# Patient Record
Sex: Female | Born: 1975 | Race: White | Hispanic: No | Marital: Single | Smoking: Former smoker
Health system: Southern US, Community
[De-identification: ages and names within clinical notes are randomized; demographics above are authoritative.]

## PROBLEM LIST (undated history)

## (undated) DIAGNOSIS — K219 Gastro-esophageal reflux disease without esophagitis: Secondary | ICD-10-CM

## (undated) DIAGNOSIS — R51 Headache: Secondary | ICD-10-CM

## (undated) DIAGNOSIS — B009 Herpesviral infection, unspecified: Secondary | ICD-10-CM

## (undated) DIAGNOSIS — N879 Dysplasia of cervix uteri, unspecified: Secondary | ICD-10-CM

## (undated) HISTORY — PX: OTHER SURGICAL HISTORY: SHX169

## (undated) HISTORY — PX: CRYOABLATION: SHX1415

## (undated) HISTORY — PX: WISDOM TOOTH EXTRACTION: SHX21

## (undated) HISTORY — PX: SEPTOPLASTY: SUR1290

---

## 2011-07-18 ENCOUNTER — Other Ambulatory Visit (HOSPITAL_COMMUNITY): Payer: Self-pay | Admitting: Gynecology

## 2011-07-18 DIAGNOSIS — N979 Female infertility, unspecified: Secondary | ICD-10-CM

## 2011-07-25 ENCOUNTER — Ambulatory Visit (HOSPITAL_COMMUNITY)
Admission: RE | Admit: 2011-07-25 | Discharge: 2011-07-25 | Disposition: A | Discharge status: Home / Self Care | Payer: BC Managed Care – PPO | Source: Ambulatory Visit | Attending: Gynecology | Admitting: Gynecology

## 2011-07-25 DIAGNOSIS — N979 Female infertility, unspecified: Secondary | ICD-10-CM | POA: Insufficient documentation

## 2011-07-25 MED ORDER — IOHEXOL 300 MG/ML  SOLN
6.0000 mL | Freq: Once | INTRAMUSCULAR | Status: AC | PRN
  Administered 2011-07-25: 6 mL

## 2011-08-11 ENCOUNTER — Ambulatory Visit (INDEPENDENT_AMBULATORY_CARE_PROVIDER_SITE_OTHER): Payer: BC Managed Care – PPO | Admitting: Psychology

## 2011-08-11 DIAGNOSIS — F4322 Adjustment disorder with anxiety: Secondary | ICD-10-CM

## 2013-02-18 LAB — OB RESULTS CONSOLE ABO/RH: RH Type: POSITIVE

## 2013-02-18 LAB — OB RESULTS CONSOLE GC/CHLAMYDIA
Chlamydia: NEGATIVE
GC PROBE AMP, GENITAL: NEGATIVE

## 2013-02-18 LAB — OB RESULTS CONSOLE RPR: RPR: NONREACTIVE

## 2013-02-18 LAB — OB RESULTS CONSOLE HEPATITIS B SURFACE ANTIGEN: Hepatitis B Surface Ag: NEGATIVE

## 2013-02-18 LAB — OB RESULTS CONSOLE RUBELLA ANTIBODY, IGM: Rubella: IMMUNE

## 2013-02-18 LAB — OB RESULTS CONSOLE HIV ANTIBODY (ROUTINE TESTING): HIV: NONREACTIVE

## 2013-02-18 LAB — OB RESULTS CONSOLE ANTIBODY SCREEN: ANTIBODY SCREEN: NEGATIVE

## 2013-07-04 ENCOUNTER — Other Ambulatory Visit (HOSPITAL_COMMUNITY): Payer: Self-pay | Admitting: Obstetrics and Gynecology

## 2013-07-04 DIAGNOSIS — R1011 Right upper quadrant pain: Secondary | ICD-10-CM

## 2013-07-07 ENCOUNTER — Ambulatory Visit (HOSPITAL_COMMUNITY)
Admission: RE | Admit: 2013-07-07 | Discharge: 2013-07-07 | Disposition: A | Discharge status: Home / Self Care | Payer: BC Managed Care – PPO | Source: Ambulatory Visit | Attending: Obstetrics and Gynecology | Admitting: Obstetrics and Gynecology

## 2013-07-07 DIAGNOSIS — O99891 Other specified diseases and conditions complicating pregnancy: Secondary | ICD-10-CM | POA: Insufficient documentation

## 2013-07-07 DIAGNOSIS — R1011 Right upper quadrant pain: Secondary | ICD-10-CM

## 2013-09-11 NOTE — H&P (Addendum)
38 yo presents for primary c-section.  [place] in office shows EFW of 4144gm, pt elects for primary c-section over trial of labor  PMHx: PSHx: All:   Meds:  PNV SHx:  Negative tobacco, etoh and ivdu  AF, VSS Gen - NAD Abd - gravid, NT CV - RRR Lungs - clear PV - cvx closed  [place]:  Vtx, EFW 4144gm, AFI 97%  A/P:  IUP at 39+ wks, suspect LGA.  Declines TOL Primary c-section R/b/a discussed, questions answered, informed consent

## 2013-09-12 ENCOUNTER — Encounter (HOSPITAL_COMMUNITY)
Admission: RE | Admit: 2013-09-12 | Discharge: 2013-09-12 | Disposition: A | Discharge status: Home / Self Care | Payer: BC Managed Care – PPO | Source: Ambulatory Visit | Attending: Obstetrics and Gynecology | Admitting: Obstetrics and Gynecology

## 2013-09-12 ENCOUNTER — Encounter (HOSPITAL_COMMUNITY): Payer: Self-pay

## 2013-09-12 DIAGNOSIS — Z01812 Encounter for preprocedural laboratory examination: Secondary | ICD-10-CM

## 2013-09-12 HISTORY — DX: Herpesviral infection, unspecified: B00.9

## 2013-09-12 HISTORY — DX: Dysplasia of cervix uteri, unspecified: N87.9

## 2013-09-12 HISTORY — DX: Headache: R51

## 2013-09-12 HISTORY — DX: Gastro-esophageal reflux disease without esophagitis: K21.9

## 2013-09-12 LAB — CBC
HCT: 40.6 % (ref 36.0–46.0)
Hemoglobin: 14 g/dL (ref 12.0–15.0)
MCH: 32.6 pg (ref 26.0–34.0)
MCHC: 34.5 g/dL (ref 30.0–36.0)
MCV: 94.4 fL (ref 78.0–100.0)
PLATELETS: 204 10*3/uL (ref 150–400)
RBC: 4.3 MIL/uL (ref 3.87–5.11)
RDW: 14.7 % (ref 11.5–15.5)
WBC: 7.6 10*3/uL (ref 4.0–10.5)

## 2013-09-12 LAB — TYPE AND SCREEN
ABO/RH(D): A POS
Antibody Screen: NEGATIVE

## 2013-09-12 LAB — ABO/RH: ABO/RH(D): A POS

## 2013-09-12 LAB — RPR: RPR Ser Ql: NONREACTIVE

## 2013-09-12 NOTE — Patient Instructions (Signed)
Your procedure is scheduled on:  Monday, September 15, 2013  Enter through the [employer] [facility] at: 6:00am  Pick up the phone at the desk and dial [phone].  Call this number if you have problems the morning of surgery: [phone].  Remember: Do NOT eat food: After midnight Sunday Do NOT drink clear liquids after: After midnight Sunday Take these medicines the morning of surgery with a SIP OF WATER: Zantac  Do NOT wear jewelry (body piercing), make-up, or nail polish. Do NOT wear lotions, powders, or perfumes.  You may wear deoderant. Do NOT shave for 48 hours prior to surgery. Do NOT bring valuables to the hospital. Contacts, dentures, or bridgework may not be worn into surgery. Leave suitcase in car.  After surgery it may be brought to your room.  For patients admitted to the hospital, checkout time is 11:00 AM the day of discharge.

## 2013-09-14 MED ORDER — DEXTROSE 5 % IV SOLN
2.0000 g | INTRAVENOUS | Status: AC
  Administered 2013-09-15: 2 g via INTRAVENOUS
  Filled 2013-09-14: qty 2

## 2013-09-15 ENCOUNTER — Inpatient Hospital Stay (HOSPITAL_COMMUNITY): Payer: BC Managed Care – PPO | Admitting: Anesthesiology

## 2013-09-15 ENCOUNTER — Encounter (HOSPITAL_COMMUNITY)
Admission: AD | Disposition: A | Discharge status: Home / Self Care | Payer: Self-pay | Source: Ambulatory Visit | Attending: Obstetrics and Gynecology

## 2013-09-15 ENCOUNTER — Inpatient Hospital Stay (HOSPITAL_COMMUNITY)
Admission: AD | Admit: 2013-09-15 | Discharge: 2013-09-18 | DRG: 766 | Disposition: A | Discharge status: Home / Self Care | Payer: BC Managed Care – PPO | Source: Ambulatory Visit | Attending: Obstetrics and Gynecology | Admitting: Obstetrics and Gynecology

## 2013-09-15 ENCOUNTER — Encounter (HOSPITAL_COMMUNITY): Payer: Self-pay | Admitting: Anesthesiology

## 2013-09-15 ENCOUNTER — Encounter (HOSPITAL_COMMUNITY): Payer: BC Managed Care – PPO | Admitting: Anesthesiology

## 2013-09-15 DIAGNOSIS — Z98891 History of uterine scar from previous surgery: Secondary | ICD-10-CM

## 2013-09-15 DIAGNOSIS — O3660X0 Maternal care for excessive fetal growth, unspecified trimester, not applicable or unspecified: Principal | ICD-10-CM | POA: Diagnosis present

## 2013-09-15 HISTORY — DX: History of uterine scar from previous surgery: Z98.891

## 2013-09-15 LAB — CBC
HCT: 40.3 % (ref 36.0–46.0)
HEMOGLOBIN: 14 g/dL (ref 12.0–15.0)
MCH: 32.7 pg (ref 26.0–34.0)
MCHC: 34.7 g/dL (ref 30.0–36.0)
MCV: 94.2 fL (ref 78.0–100.0)
PLATELETS: 202 10*3/uL (ref 150–400)
RBC: 4.28 MIL/uL (ref 3.87–5.11)
RDW: 14.9 % (ref 11.5–15.5)
WBC: 9.9 10*3/uL (ref 4.0–10.5)

## 2013-09-15 SURGERY — Surgical Case
Anesthesia: Spinal | Site: Abdomen

## 2013-09-15 MED ORDER — PHENYLEPHRINE 40 MCG/ML (10ML) SYRINGE FOR IV PUSH (FOR BLOOD PRESSURE SUPPORT)
PREFILLED_SYRINGE | INTRAVENOUS | Status: AC
  Filled 2013-09-15: qty 5

## 2013-09-15 MED ORDER — SCOPOLAMINE 1 MG/3DAYS TD PT72
1.0000 | MEDICATED_PATCH | Freq: Once | TRANSDERMAL | Status: DC
  Administered 2013-09-15: 1.5 mg via TRANSDERMAL

## 2013-09-15 MED ORDER — ONDANSETRON HCL 4 MG PO TABS: 4.0000 mg | ORAL_TABLET | ORAL | Status: DC | PRN

## 2013-09-15 MED ORDER — ONDANSETRON HCL 4 MG/2ML IJ SOLN
INTRAMUSCULAR | Status: AC
  Filled 2013-09-15: qty 2

## 2013-09-15 MED ORDER — BUPIVACAINE IN DEXTROSE 0.75-8.25 % IT SOLN
INTRATHECAL | Status: DC | PRN
  Administered 2013-09-15: 1.8 mL via INTRATHECAL

## 2013-09-15 MED ORDER — OXYTOCIN 40 UNITS IN LACTATED RINGERS INFUSION - SIMPLE MED: 62.5000 mL/h | INTRAVENOUS | Status: AC

## 2013-09-15 MED ORDER — ONDANSETRON HCL 4 MG/2ML IJ SOLN: 4.0000 mg | Freq: Three times a day (TID) | INTRAMUSCULAR | Status: DC | PRN

## 2013-09-15 MED ORDER — CITRIC ACID-SODIUM CITRATE 334-500 MG/5ML PO SOLN
ORAL | Status: AC
  Administered 2013-09-15: 30 mL via ORAL
  Filled 2013-09-15: qty 15

## 2013-09-15 MED ORDER — FENTANYL CITRATE 0.05 MG/ML IJ SOLN
INTRAMUSCULAR | Status: AC
  Filled 2013-09-15: qty 2

## 2013-09-15 MED ORDER — LANOLIN HYDROUS EX OINT
1.0000 | TOPICAL_OINTMENT | CUTANEOUS | Status: DC | PRN
Start: 2013-09-15 — End: 2013-09-18

## 2013-09-15 MED ORDER — MEPERIDINE HCL 25 MG/ML IJ SOLN: 6.2500 mg | INTRAMUSCULAR | Status: DC | PRN

## 2013-09-15 MED ORDER — DIPHENHYDRAMINE HCL 25 MG PO CAPS: 25.0000 mg | ORAL_CAPSULE | Freq: Four times a day (QID) | ORAL | Status: DC | PRN

## 2013-09-15 MED ORDER — PHENYLEPHRINE 8 MG IN D5W 100 ML (0.08MG/ML) PREMIX OPTIME
INJECTION | INTRAVENOUS | Status: DC | PRN
  Administered 2013-09-15: 40 ug/min via INTRAVENOUS
  Administered 2013-09-15: 60 ug/min via INTRAVENOUS

## 2013-09-15 MED ORDER — NALOXONE HCL 1 MG/ML IJ SOLN
1.0000 ug/kg/h | INTRAMUSCULAR | Status: DC | PRN
  Filled 2013-09-15: qty 2

## 2013-09-15 MED ORDER — SODIUM CHLORIDE 0.9 % IJ SOLN: 3.0000 mL | INTRAMUSCULAR | Status: DC | PRN

## 2013-09-15 MED ORDER — LACTATED RINGERS IV SOLN
INTRAVENOUS | Status: DC
  Administered 2013-09-15 (×3): via INTRAVENOUS

## 2013-09-15 MED ORDER — NALOXONE HCL 0.4 MG/ML IJ SOLN: 0.4000 mg | INTRAMUSCULAR | Status: DC | PRN

## 2013-09-15 MED ORDER — CITRIC ACID-SODIUM CITRATE 334-500 MG/5ML PO SOLN
30.0000 mL | Freq: Once | ORAL | Status: AC
  Administered 2013-09-15: 30 mL via ORAL

## 2013-09-15 MED ORDER — OXYCODONE-ACETAMINOPHEN 5-325 MG PO TABS: 1.0000 | ORAL_TABLET | ORAL | Status: DC | PRN

## 2013-09-15 MED ORDER — MIDAZOLAM HCL 2 MG/2ML IJ SOLN: 0.5000 mg | Freq: Once | INTRAMUSCULAR | Status: DC | PRN

## 2013-09-15 MED ORDER — DIPHENHYDRAMINE HCL 50 MG/ML IJ SOLN
25.0000 mg | INTRAMUSCULAR | Status: DC | PRN
Start: 2013-09-15 — End: 2013-09-18

## 2013-09-15 MED ORDER — OXYTOCIN 40 UNITS IN LACTATED RINGERS INFUSION - SIMPLE MED
INTRAVENOUS | Status: DC | PRN
  Administered 2013-09-15: 40 [IU] via INTRAVENOUS

## 2013-09-15 MED ORDER — TETANUS-DIPHTH-ACELL PERTUSSIS 5-2.5-18.5 LF-MCG/0.5 IM SUSP: 0.5000 mL | Freq: Once | INTRAMUSCULAR | Status: DC

## 2013-09-15 MED ORDER — PROMETHAZINE HCL 25 MG/ML IJ SOLN: 6.2500 mg | INTRAMUSCULAR | Status: DC | PRN

## 2013-09-15 MED ORDER — DEXTROSE IN LACTATED RINGERS 5 % IV SOLN
INTRAVENOUS | Status: DC
  Administered 2013-09-15: 125 mL/h via INTRAVENOUS
  Administered 2013-09-16: 03:00:00 via INTRAVENOUS

## 2013-09-15 MED ORDER — NALBUPHINE HCL 10 MG/ML IJ SOLN: 5.0000 mg | INTRAMUSCULAR | Status: DC | PRN

## 2013-09-15 MED ORDER — PHENYLEPHRINE HCL 10 MG/ML IJ SOLN: INTRAMUSCULAR | Status: DC | PRN

## 2013-09-15 MED ORDER — SIMETHICONE 80 MG PO CHEW
80.0000 mg | CHEWABLE_TABLET | Freq: Three times a day (TID) | ORAL | Status: DC
  Administered 2013-09-15 – 2013-09-17 (×7): 80 mg via ORAL
  Filled 2013-09-15 (×8): qty 1

## 2013-09-15 MED ORDER — FENTANYL CITRATE 0.05 MG/ML IJ SOLN: 25.0000 ug | INTRAMUSCULAR | Status: DC | PRN

## 2013-09-15 MED ORDER — PRENATAL MULTIVITAMIN CH
1.0000 | ORAL_TABLET | Freq: Every day | ORAL | Status: DC
  Administered 2013-09-16 – 2013-09-17 (×2): 1 via ORAL
  Filled 2013-09-15 (×2): qty 1

## 2013-09-15 MED ORDER — MEASLES, MUMPS & RUBELLA VAC ~~LOC~~ INJ
0.5000 mL | INJECTION | Freq: Once | SUBCUTANEOUS | Status: DC
  Filled 2013-09-15: qty 0.5

## 2013-09-15 MED ORDER — KETOROLAC TROMETHAMINE 30 MG/ML IJ SOLN
30.0000 mg | Freq: Four times a day (QID) | INTRAMUSCULAR | Status: AC | PRN
  Administered 2013-09-15: 30 mg via INTRAMUSCULAR

## 2013-09-15 MED ORDER — SCOPOLAMINE 1 MG/3DAYS TD PT72
MEDICATED_PATCH | TRANSDERMAL | Status: AC
Start: 2013-09-15 — End: 2013-09-18
  Administered 2013-09-15: 1.5 mg via TRANSDERMAL
  Filled 2013-09-15: qty 1

## 2013-09-15 MED ORDER — MORPHINE SULFATE 0.5 MG/ML IJ SOLN
INTRAMUSCULAR | Status: AC
  Filled 2013-09-15: qty 10

## 2013-09-15 MED ORDER — DIPHENHYDRAMINE HCL 50 MG/ML IJ SOLN: 12.5000 mg | INTRAMUSCULAR | Status: DC | PRN

## 2013-09-15 MED ORDER — HYDROCORTISONE ACE-PRAMOXINE 1-1 % RE FOAM
1.0000 | Freq: Three times a day (TID) | RECTAL | Status: DC
  Administered 2013-09-15 – 2013-09-17 (×4): 1 via RECTAL
  Filled 2013-09-15: qty 10

## 2013-09-15 MED ORDER — LACTATED RINGERS IV SOLN
INTRAVENOUS | Status: DC | PRN
  Administered 2013-09-15: 08:00:00 via INTRAVENOUS

## 2013-09-15 MED ORDER — KETOROLAC TROMETHAMINE 30 MG/ML IJ SOLN: 30.0000 mg | Freq: Four times a day (QID) | INTRAMUSCULAR | Status: AC | PRN

## 2013-09-15 MED ORDER — SIMETHICONE 80 MG PO CHEW: 80.0000 mg | CHEWABLE_TABLET | ORAL | Status: DC | PRN

## 2013-09-15 MED ORDER — KETOROLAC TROMETHAMINE 30 MG/ML IJ SOLN
INTRAMUSCULAR | Status: AC
  Administered 2013-09-15: 30 mg via INTRAMUSCULAR
  Filled 2013-09-15: qty 1

## 2013-09-15 MED ORDER — MORPHINE SULFATE (PF) 0.5 MG/ML IJ SOLN
INTRAMUSCULAR | Status: DC | PRN
  Administered 2013-09-15: .15 mg via INTRATHECAL

## 2013-09-15 MED ORDER — MEDROXYPROGESTERONE ACETATE 150 MG/ML IM SUSP: 150.0000 mg | INTRAMUSCULAR | Status: DC | PRN

## 2013-09-15 MED ORDER — PHENYLEPHRINE 8 MG IN D5W 100 ML (0.08MG/ML) PREMIX OPTIME
INJECTION | INTRAVENOUS | Status: AC
  Filled 2013-09-15: qty 100

## 2013-09-15 MED ORDER — 0.9 % SODIUM CHLORIDE (POUR BTL) OPTIME
TOPICAL | Status: DC | PRN
  Administered 2013-09-15: 1000 mL

## 2013-09-15 MED ORDER — FENTANYL CITRATE 0.05 MG/ML IJ SOLN
INTRAMUSCULAR | Status: DC | PRN
  Administered 2013-09-15: 25 ug via INTRATHECAL

## 2013-09-15 MED ORDER — SENNOSIDES-DOCUSATE SODIUM 8.6-50 MG PO TABS
2.0000 | ORAL_TABLET | ORAL | Status: DC
  Administered 2013-09-16 – 2013-09-18 (×3): 2 via ORAL
  Filled 2013-09-15 (×3): qty 2

## 2013-09-15 MED ORDER — METOCLOPRAMIDE HCL 5 MG/ML IJ SOLN: 10.0000 mg | Freq: Three times a day (TID) | INTRAMUSCULAR | Status: DC | PRN

## 2013-09-15 MED ORDER — IBUPROFEN 600 MG PO TABS
600.0000 mg | ORAL_TABLET | Freq: Four times a day (QID) | ORAL | Status: DC
  Administered 2013-09-15 – 2013-09-18 (×11): 600 mg via ORAL
  Filled 2013-09-15 (×11): qty 1

## 2013-09-15 MED ORDER — OXYTOCIN 10 UNIT/ML IJ SOLN
INTRAMUSCULAR | Status: AC
  Filled 2013-09-15: qty 4

## 2013-09-15 MED ORDER — DIBUCAINE 1 % RE OINT
1.0000 | TOPICAL_OINTMENT | RECTAL | Status: DC | PRN
Start: 2013-09-15 — End: 2013-09-18

## 2013-09-15 MED ORDER — MENTHOL 3 MG MT LOZG: 1.0000 | LOZENGE | OROMUCOSAL | Status: DC | PRN

## 2013-09-15 MED ORDER — HYDROCORTISONE ACE-PRAMOXINE 2.5-1 % RE CREA: TOPICAL_CREAM | Freq: Three times a day (TID) | RECTAL | Status: DC

## 2013-09-15 MED ORDER — WITCH HAZEL-GLYCERIN EX PADS
1.0000 "application " | MEDICATED_PAD | CUTANEOUS | Status: DC | PRN
  Administered 2013-09-15: 1 via TOPICAL

## 2013-09-15 MED ORDER — ACETAMINOPHEN 500 MG PO TABS
1000.0000 mg | ORAL_TABLET | Freq: Four times a day (QID) | ORAL | Status: AC
  Administered 2013-09-15: 1000 mg via ORAL
  Filled 2013-09-15: qty 2

## 2013-09-15 MED ORDER — SIMETHICONE 80 MG PO CHEW
80.0000 mg | CHEWABLE_TABLET | ORAL | Status: DC
  Administered 2013-09-16 – 2013-09-18 (×3): 80 mg via ORAL
  Filled 2013-09-15 (×3): qty 1

## 2013-09-15 MED ORDER — IBUPROFEN 600 MG PO TABS: 600.0000 mg | ORAL_TABLET | Freq: Four times a day (QID) | ORAL | Status: DC | PRN

## 2013-09-15 MED ORDER — SCOPOLAMINE 1 MG/3DAYS TD PT72
1.0000 | MEDICATED_PATCH | Freq: Once | TRANSDERMAL | Status: DC
  Filled 2013-09-15: qty 1

## 2013-09-15 MED ORDER — DIPHENHYDRAMINE HCL 25 MG PO CAPS: 25.0000 mg | ORAL_CAPSULE | ORAL | Status: DC | PRN

## 2013-09-15 MED ORDER — ONDANSETRON HCL 4 MG/2ML IJ SOLN: 4.0000 mg | INTRAMUSCULAR | Status: DC | PRN

## 2013-09-15 MED ORDER — ONDANSETRON HCL 4 MG/2ML IJ SOLN
INTRAMUSCULAR | Status: DC | PRN
  Administered 2013-09-15: 4 mg via INTRAVENOUS

## 2013-09-15 SURGICAL SUPPLY — 31 items
CLAMP CORD UMBIL (MISCELLANEOUS) IMPLANT
CLOTH BEACON ORANGE TIMEOUT ST (SAFETY) ×2 IMPLANT
DERMABOND ADVANCED (GAUZE/BANDAGES/DRESSINGS) ×1
DERMABOND ADVANCED .7 DNX12 (GAUZE/BANDAGES/DRESSINGS) ×1 IMPLANT
DRAPE LG THREE QUARTER DISP (DRAPES) ×2 IMPLANT
DRSG OPSITE POSTOP 4X10 (GAUZE/BANDAGES/DRESSINGS) ×2 IMPLANT
DURAPREP 26ML APPLICATOR (WOUND CARE) ×2 IMPLANT
ELECT REM PT RETURN 9FT ADLT (ELECTROSURGICAL) ×2
ELECTRODE REM PT RTRN 9FT ADLT (ELECTROSURGICAL) ×1 IMPLANT
EXTRACTOR VACUUM M CUP 4 TUBE (SUCTIONS) ×2 IMPLANT
GLOVE BIO SURGEON STRL SZ 6.5 (GLOVE) ×2 IMPLANT
GLOVE BIOGEL PI IND STRL 7.0 (GLOVE) ×3 IMPLANT
GLOVE BIOGEL PI INDICATOR 7.0 (GLOVE) ×3
GOWN PREVENTION PLUS XLARGE (GOWN DISPOSABLE) ×4 IMPLANT
GOWN STRL REIN XL XLG (GOWN DISPOSABLE) ×4 IMPLANT
KIT ABG SYR 3ML LUER SLIP (SYRINGE) IMPLANT
NEEDLE HYPO 25X5/8 SAFETYGLIDE (NEEDLE) IMPLANT
NS IRRIG 1000ML POUR BTL (IV SOLUTION) ×2 IMPLANT
PACK C SECTION WH (CUSTOM PROCEDURE TRAY) ×2 IMPLANT
PAD OB MATERNITY 4.3X12.25 (PERSONAL CARE ITEMS) ×2 IMPLANT
PENCIL BUTTON HOLSTER BLD 10FT (ELECTRODE) ×2 IMPLANT
STAPLER VISISTAT 35W (STAPLE) IMPLANT
SUT CHROMIC 0 CT 802H (SUTURE) IMPLANT
SUT CHROMIC 0 CTX 36 (SUTURE) ×6 IMPLANT
SUT MON AB-0 CT1 36 (SUTURE) ×2 IMPLANT
SUT PDS AB 0 CTX 60 (SUTURE) ×2 IMPLANT
SUT PLAIN 0 NONE (SUTURE) IMPLANT
SUT VIC AB 4-0 KS 27 (SUTURE) ×2 IMPLANT
TOWEL OR 17X24 6PK STRL BLUE (TOWEL DISPOSABLE) ×2 IMPLANT
TRAY FOLEY CATH 14FR (SET/KITS/TRAYS/PACK) ×2 IMPLANT
WATER STERILE IRR 1000ML POUR (IV SOLUTION) IMPLANT

## 2013-09-15 NOTE — Anesthesia Procedure Notes (Signed)
Spinal  Patient location during procedure: OR Start time: 09/15/2013 7:28 AM Staffing Anesthesiologist: Etoile Looman A. Preanesthetic Checklist Completed: patient identified, site marked, surgical consent, pre-op evaluation, timeout performed, IV checked, risks and benefits discussed and monitors and equipment checked Spinal Block Patient position: sitting Prep: site prepped and draped and DuraPrep Patient monitoring: heart rate, cardiac monitor, continuous pulse ox and blood pressure Approach: midline Location: L3-4 Injection technique: single-shot Needle Needle type: Sprotte  Needle gauge: 24 G Needle length: 9 cm Needle insertion depth: 5 cm Assessment Sensory level: T4 Events: paresthesia Additional Notes Patient tolerated procedure well. Transient paresthesia right foot. Adequate sensory level.

## 2013-09-15 NOTE — Anesthesia Preprocedure Evaluation (Signed)
Anesthesia Evaluation  Patient identified by MRN, date of birth, ID band Patient awake    Reviewed: Allergy & Precautions, H&P , NPO status , Patient's Chart, lab work & pertinent test results  Airway Mallampati: II      Dental   Pulmonary former smoker,  breath sounds clear to auscultation        Cardiovascular Exercise Tolerance: Good Rhythm:regular Rate:Normal     Neuro/Psych  Headaches,    GI/Hepatic GERD-  ,  Endo/Other    Renal/GU      Musculoskeletal   Abdominal   Peds  Hematology   Anesthesia Other Findings HSV  Reproductive/Obstetrics (+) Pregnancy                           Anesthesia Physical Anesthesia Plan  ASA: II  Anesthesia Plan: Spinal   Post-op Pain Management:    Induction:   Airway Management Planned:   Additional Equipment:   Intra-op Plan:   Post-operative Plan:   Informed Consent: I have reviewed the patients History and Physical, chart, labs and discussed the procedure including the risks, benefits and alternatives for the proposed anesthesia with the patient or authorized representative who has indicated his/her understanding and acceptance.     Plan Discussed with: Anesthesiologist, CRNA and Surgeon  Anesthesia Plan Comments:         Anesthesia Quick Evaluation

## 2013-09-15 NOTE — Op Note (Signed)
Cesarean Section Procedure Note   Ann Gonzalez  09/15/2013  Indications: Scheduled Proceedure/Maternal Request   Pre-operative Diagnosis: Suspect Large for gestational age.   Post-operative Diagnosis: Same   Surgeon: Surgeon(s) and Role:    * Marylynn Pearson, MD - Primary   Assistants: none  Anesthesia: spinal   Procedure Details:  The patient was seen in the Holding Room. The risks, benefits, complications, treatment options, and expected outcomes were discussed with the patient. The patient concurred with the proposed plan, giving informed consent. identified as Ann Gonzalez and the procedure verified as C-Section Delivery. A Time Out was held and the above information confirmed.  After induction of anesthesia, the patient was draped and prepped in the usual sterile manner. A transverse was made and carried down through the subcutaneous tissue to the fascia. Fascial incision was made and extended transversely. The fascia was separated from the underlying rectus tissue superiorly and inferiorly. The peritoneum was identified and entered. Peritoneal incision was extended longitudinally. The utero-vesical peritoneal reflection was incised transversely and the bladder flap was bluntly freed from the lower uterine segment. A low transverse uterine incision was made. Delivered from cephalic presentation was a vigerous female with Apgar scores of 8 at one minute and 8 at five minutes. Cord ph was not sent the umbilical cord was clamped and cut cord blood was obtained for evaluation. The placenta was removed manually and appeared normal. The uterine outline, tubes and ovaries appeared normal}. The uterine incision was closed with running locked sutures of 0chromic gut.   Hemostasis was observed. Lavage was carried out until clear. Peritoneum was closed with monocryl.   The fascia was then reapproximated with running sutures of 0PDS. The skin was closed with 4-0Vicryl.   Instrument, sponge,  and needle counts were correct prior the abdominal closure and were correct at the conclusion of the case.     Estimated Blood Loss:800cc   Urine Output: clear  Specimens: placenta   Complications: no complications  Disposition: PACU - hemodynamically stable.   Maternal Condition: stable   Baby condition / location:  Couplet care / Skin to Skin  Attending Attestation: I was present and scrubbed for the entire procedure.   Signed: Surgeon(s): Marylynn Pearson, MD

## 2013-09-15 NOTE — Lactation Note (Signed)
This note was copied from the chart of Ann Gonzalez. Lactation Consultation Note  Patient Name: Ann Cande Mastropietro Today's Date: 09/15/2013 Reason for consult: Initial assessment This LC called to see Dyad in room #123 due to low blood sugar of 37 .  Baby had latched well in PACU per mom , and since 3 mins and released. MBU RN Candyce Churn at bedside working with mom with latch . Baby latched with depth  Multiply swallows noted , increased with breast compressions. Baby fed for 14 mins and was  Consistent . Baby released , switched to 2nd breast ( left ) and fed for 5 mins with multiply swallows ,baby released and fell asleep. Admission RN - Lovett Sox aware of good latch and consistent pattern.  Also MBU RN Candyce Churn aware of feeding results. Plan is for a F/U serum blood sugar.    Maternal Data Formula Feeding for Exclusion: No Infant to breast within first hour of birth: Yes Has patient been taught Hand Expression?: Yes Does the patient have breastfeeding experience prior to this delivery?: No  Feeding Feeding Type: Breast Fed (multiply swallows noted ) Length of feed: 5 min  LATCH Score/Interventions Latch: Grasps breast easily, tongue down, lips flanged, rhythmical sucking. Intervention(s): Adjust position;Assist with latch;Breast massage;Breast compression  Audible Swallowing: Spontaneous and intermittent Intervention(s): Skin to skin Intervention(s): Skin to skin  Type of Nipple: Everted at rest and after stimulation  Comfort (Breast/Nipple): Soft / non-tender     Hold (Positioning): Assistance needed to correctly position infant at breast and maintain latch. Intervention(s): Breastfeeding basics reviewed;Support Pillows;Position options;Skin to skin  LATCH Score: 9  Lactation Tools Discussed/Used     Consult Status Consult Status: Follow-up Date: 09/16/13 Follow-up type: In-patient    Myer Haff 09/15/2013, 4:40 PM

## 2013-09-15 NOTE — Lactation Note (Signed)
This note was copied from the chart of Ann Gonzalez. Lactation Consultation Note  Patient Name: Ann Gonzalez [id number] Date: 09/15/2013 Reason for consult: Follow-up assessment;Difficult latch Asked by RN to assist Mom with breastfeeding. Baby blood sugar has dropped again into the 30's. Baby sleepy upon arrival but LC was able to wake baby with diaper change. Attempted to latch baby to left breast in football hold, baby latched off and on for approx 10 minutes. Changed to right breast laid back position, Baby latched after few attempts and sustained a good suckling pattern at the breast for another 20 minutes. Full assist with LC at this visit to keep baby latched and positioned. Demonstrated to [place] partner how to assist with latching baby and how to perform breast compression if needed.   Maternal Data    Feeding Feeding Type: Formula Length of feed: 30 min  LATCH Score/Interventions Latch: Repeated attempts needed to sustain latch, nipple held in mouth throughout feeding, stimulation needed to elicit sucking reflex. Intervention(s): Adjust position;Assist with latch;Breast massage;Breast compression  Audible Swallowing: A few with stimulation Intervention(s): Skin to skin  Type of Nipple: Flat  Comfort (Breast/Nipple): Soft / non-tender     Hold (Positioning): Full assist, staff holds infant at breast Intervention(s): Breastfeeding basics reviewed;Support Pillows;Position options;Skin to skin  LATCH Score: 5  Lactation Tools Discussed/Used     Consult Status Consult Status: Follow-up Date: 09/15/13 Follow-up type: In-patient    [name] 09/15/2013, 11:10 PM

## 2013-09-15 NOTE — Transfer of Care (Signed)
Immediate Anesthesia Transfer of Care Note  Patient: Ann Gonzalez  Procedure(s) Performed: Procedure(s) with comments: CESAREAN SECTION (N/A) - PRIMARY EDC 1/20  Patient Location: PACU  Anesthesia Type:Spinal  Level of Consciousness: awake, alert  and oriented  Airway & Oxygen Therapy: Patient Spontanous Breathing  Post-op Assessment: Report given to PACU RN and Post -op Vital signs reviewed and stable  Post vital signs: Reviewed and stable  Complications: No apparent anesthesia complications

## 2013-09-15 NOTE — Anesthesia Postprocedure Evaluation (Signed)
Anesthesia Post Note  Patient: Ann Gonzalez  Procedure(s) Performed: Procedure(s) (LRB): CESAREAN SECTION (N/A)  Anesthesia type: Epidural  Patient location: Mother/Baby  Post pain: Pain level controlled  Post assessment: Post-op Vital signs reviewed  Last Vitals:  Filed Vitals:   09/15/13 1343  BP: 105/67  Pulse: 78  Temp:   Resp: 18    Post vital signs: Reviewed  Level of consciousness:alert  Complications: No apparent anesthesia complications

## 2013-09-15 NOTE — Anesthesia Postprocedure Evaluation (Signed)
  Anesthesia Post-op Note  Patient: Ann Gonzalez  Procedure(s) Performed: Procedure(s) with comments: CESAREAN SECTION (N/A) - PRIMARY EDC 1/20  Patient Location: PACU  Anesthesia Type:Spinal  Level of Consciousness: awake, alert  and oriented  Airway and Oxygen Therapy: Patient Spontanous Breathing  Post-op Pain: none  Post-op Assessment: Post-op Vital signs reviewed, Patient's Cardiovascular Status Stable, Respiratory Function Stable, Patent Airway, No signs of Nausea or vomiting, Pain level controlled, No headache, No backache, No residual numbness and No residual motor weakness  Post-op Vital Signs: Reviewed and stable  Complications: No apparent anesthesia complications

## 2013-09-15 NOTE — Lactation Note (Signed)
This note was copied from the chart of Ann Gonzalez. Lactation Consultation Note  Patient Name: Ann Gonzalez [id number] Date: 09/15/2013 Reason for consult: Follow-up assessment;Difficult latch Peds has ordered baby be supplemented with formula after each feeding tonight and they will re-check blood sugar in AM per nursery RN. Demonstrated to Mom's partner how to supplement using foley cup and curved tipped syringe. Curved tipped syringe worked the best. Plan for tonight is Mom will breastfeed each feeding, and partner will supplement using curved tipped syringe 10 ml of Enfamil, 20 cal formula. BF with feeding ques, but at least every 3 hours. LC follow up tomorrow.   Maternal Data    Feeding Feeding Type: Formula Length of feed: 30 min  LATCH Score/Interventions Latch: Repeated attempts needed to sustain latch, nipple held in mouth throughout feeding, stimulation needed to elicit sucking reflex. Intervention(s): Adjust position;Assist with latch;Breast massage;Breast compression  Audible Swallowing: A few with stimulation Intervention(s): Skin to skin  Type of Nipple: Flat  Comfort (Breast/Nipple): Soft / non-tender     Hold (Positioning): Full assist, staff holds infant at breast Intervention(s): Breastfeeding basics reviewed;Support Pillows;Position options;Skin to skin  LATCH Score: 5  Lactation Tools Discussed/Used     Consult Status Consult Status: Follow-up Date: 09/15/13 Follow-up type: In-patient    [name] 09/15/2013, 11:13 PM

## 2013-09-16 ENCOUNTER — Encounter (HOSPITAL_COMMUNITY): Payer: Self-pay | Admitting: Obstetrics and Gynecology

## 2013-09-16 LAB — COMPREHENSIVE METABOLIC PANEL
ALBUMIN: 1.9 g/dL — AB (ref 3.5–5.2)
ALT: 13 U/L (ref 0–35)
AST: 38 U/L — ABNORMAL HIGH (ref 0–37)
Alkaline Phosphatase: 137 U/L — ABNORMAL HIGH (ref 39–117)
BUN: 9 mg/dL (ref 6–23)
CALCIUM: 8.7 mg/dL (ref 8.4–10.5)
CO2: 22 mEq/L (ref 19–32)
CREATININE: 0.81 mg/dL (ref 0.50–1.10)
Chloride: 107 mEq/L (ref 96–112)
GFR calc Af Amer: >90 mL/min (ref >90)
GFR calc non Af Amer: >90 mL/min (ref >90)
Glucose, Bld: 92 mg/dL (ref 70–99)
Potassium: 4.3 mEq/L (ref 3.7–5.3)
Sodium: 140 mEq/L (ref 137–147)
TOTAL PROTEIN: 5.3 g/dL — AB (ref 6.0–8.3)
Total Bilirubin: 0.3 mg/dL (ref 0.3–1.2)

## 2013-09-16 LAB — CBC
HCT: 36.5 % (ref 36.0–46.0)
Hemoglobin: 12.5 g/dL (ref 12.0–15.0)
MCH: 32.5 pg (ref 26.0–34.0)
MCHC: 34.2 g/dL (ref 30.0–36.0)
MCV: 94.8 fL (ref 78.0–100.0)
Platelets: 173 10*3/uL (ref 150–400)
RBC: 3.85 MIL/uL — ABNORMAL LOW (ref 3.87–5.11)
RDW: 14.8 % (ref 11.5–15.5)
WBC: 9.7 10*3/uL (ref 4.0–10.5)

## 2013-09-16 NOTE — Lactation Note (Signed)
This note was copied from the chart of Ann Gonzalez. Lactation Consultation Note  Patient Name: Ann Gonzalez [id number] Date: 09/16/2013 Reason for consult: Follow-up assessment;Breast/nipple pain;Difficult latch;Other (Comment) (LGA baby with initial blood sugar concerns, now resolved) Mom states that Pediatrician did not require continued formula supplementation and she is cue feeding baby at one or both breasts. LC observed latch and demonstrated chin tug technique; comfort gelpads given for nipple care between feedings since mom reports some soreness of (R) nipple, especially during intial latch.  LC recommends applying some colostrum on her nipple prior to latch and the flow is readily obtained.  LC provided comfort gelpads and discussed assessment and recommendations with RN, Ann Gonzalez.   Maternal Data    Feeding Feeding Type: Breast Fed  LATCH Score/Interventions Latch: Grasps breast easily, tongue down, lips flanged, rhythmical sucking. Intervention(s): Assist with latch (brief chin tug, encouraged MGM or family member to do this)  Audible Swallowing: Spontaneous and intermittent Intervention(s): Skin to skin;Hand expression Intervention(s): Skin to skin;Hand expression;Alternate breast massage  Type of Nipple: Everted at rest and after stimulation  Comfort (Breast/Nipple): Filling, red/small blisters or bruises, mild/mod discomfort (mom says the (R) is more sore than (L) but less pain with chin tug)  Problem noted: Mild/Moderate discomfort  Hold (Positioning): No assistance needed to correctly position infant at breast. Intervention(s): Breastfeeding basics reviewed;Support Pillows;Position options;Skin to skin  LATCH Score: 9  (LC observed but mom able to latch independently)  Lactation Tools Discussed/Used Tools: Comfort gels Review of chin tug technique for wider areolar grasp, which relieved nipple discomfort, per mom  Consult Status Consult Status:  Follow-up Date: 09/17/13 Follow-up type: In-patient    Junious Dresser [facility] 09/16/2013, 9:19 PM

## 2013-09-16 NOTE — Progress Notes (Signed)
Subjective: Postpartum Day 1: Cesarean Delivery Patient reports tolerating PO and + flatus.    Objective: Vital signs in last 24 hours: Temp:  [97.6 F (36.4 C)-98.5 F (36.9 C)] 97.6 F (36.4 C) (01/20 0400) Pulse Rate:  [66-103] 80 (01/20 0400) Resp:  [17-25] 18 (01/20 0400) BP: (102-141)/(57-86) 115/73 mmHg (01/20 0400) SpO2:  [95 %-99 %] 96 % (01/20 0400) Weight:  [225 lb (102.059 kg)] 225 lb (102.059 kg) (01/19 1100)  Physical Exam:  General: alert and cooperative Lochia: appropriate Uterine Fundus: firm Incision: honeycomb dressing noted with small old drainage on bandage DVT Evaluation: No evidence of DVT seen on physical exam. Negative Homan's sign. No cords or calf tenderness. No significant calf/ankle edema.   Recent Labs  09/15/13 0637 09/16/13 0600  HGB 14.0 12.5  HCT 40.3 36.5  Foley with adequate clear urine   Assessment/Plan: Status post Cesarean section. Doing well postoperatively.  Continue current care.  Sabastian Raimondi G 09/16/2013, 8:15 AM
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
## 2013-09-17 MED ORDER — HYDROCORTISONE 1 % EX CREA
TOPICAL_CREAM | Freq: Three times a day (TID) | CUTANEOUS | Status: DC
  Administered 2013-09-17 (×3): via TOPICAL
  Filled 2013-09-17: qty 28

## 2013-09-17 NOTE — Lactation Note (Addendum)
This note was copied from the chart of Ann Iysis McCamish. Lactation Consultation Note Follow up at 57 hours.  Mom complains of nipple pain and difficulty with hand expression.  Greater than 12 feedings in past 24 hours and 5 voids, 5 stools. Discouraged use of lanolin and assisted with hand expression, mom is able to do return demonstration well.  Mom to use expressed colostrum on nipple and increase use of comfort gels, previously given to mom.  Mom's partner and grandmother at bedside encouraging and supportive.  Mom's nipples are bruised with a few cracks at base.  Encouraged active feedings to maintain good latch.  Minimal assistance needed in football hold to latch baby. Audible gulping heard with good latch observed.  Demonstrated use of hand pump per mom's request and encouraged to use to stimulated nipple prior to feedings.  Observed 15 minutes of feeding and baby continues.  Mom to call for assist as needed.    Patient Name: Ann Gonzalez [id number] Date: 09/17/2013 Reason for consult: Follow-up assessment;Breast/nipple pain;Difficult latch   Maternal Data    Feeding Feeding Type: Breast Fed Length of feed:  (15 minutes observed)  LATCH Score/Interventions Latch: Grasps breast easily, tongue down, lips flanged, rhythmical sucking. Intervention(s): Adjust position;Assist with latch  Audible Swallowing: Spontaneous and intermittent Intervention(s): Skin to skin;Hand expression  Type of Nipple: Everted at rest and after stimulation Intervention(s): Hand pump  Comfort (Breast/Nipple): Filling, red/small blisters or bruises, mild/mod discomfort  Problem noted: Mild/Moderate discomfort Interventions (Mild/moderate discomfort): Comfort gels;Hand expression  Hold (Positioning): Assistance needed to correctly position infant at breast and maintain latch.  LATCH Score: 8  Lactation Tools Discussed/Used Tools: Comfort gels   Consult Status Consult Status: Follow-up Date:  09/18/13 Follow-up type: In-patient    Shoptaw, Justine Null 09/17/2013, 5:02 PM

## 2013-09-17 NOTE — Progress Notes (Cosign Needed)
Subjective: Postpartum Day 2: Cesarean Delivery Patient reports tolerating PO, + BM and no problems voiding.    Objective: Vital signs in last 24 hours: Temp:  [98.5 F (36.9 C)-98.8 F (37.1 C)] 98.8 F (37.1 C) (01/21 0615) Pulse Rate:  [73-90] 73 (01/21 0615) Resp:  [18-20] 20 (01/21 0615) BP: (124-128)/(85-90) 127/90 mmHg (01/21 0615) SpO2:  [95 %-97 %] 97 % (01/21 0615)  Physical Exam:  General: alert and cooperative Lochia: appropriate Uterine Fundus: firm Incision: healing well, adhesive skin reaction noted around bandage, foley catheter leg tubing attachment site and epidural site, Skin erythematous and weepy  DVT Evaluation: No evidence of DVT seen on physical exam. Negative Homan's sign. No cords or calf tenderness. No significant calf/ankle edema.   Recent Labs  09/15/13 0637 09/16/13 0600  HGB 14.0 12.5  HCT 40.3 36.5    Assessment/Plan: Status post Cesarean section. Doing well postoperatively.  Continue current care.  Hydrocortisone to affected areas  Jarreau Callanan G 09/17/2013, 8:17 AM

## 2013-09-18 MED ORDER — IBUPROFEN 600 MG PO TABS: 600.0000 mg | ORAL_TABLET | Freq: Four times a day (QID) | ORAL | Status: DC | PRN

## 2013-09-18 MED ORDER — HYDROCORTISONE 1 % EX CREA: TOPICAL_CREAM | Freq: Three times a day (TID) | CUTANEOUS | Status: DC

## 2013-09-18 MED ORDER — CEPHALEXIN 500 MG PO CAPS
500.0000 mg | ORAL_CAPSULE | Freq: Four times a day (QID) | ORAL | Status: DC
  Filled 2013-09-18 (×2): qty 1

## 2013-09-18 MED ORDER — CEPHALEXIN 500 MG PO CAPS: 500.0000 mg | ORAL_CAPSULE | Freq: Four times a day (QID) | ORAL | Status: DC

## 2013-09-18 MED ORDER — OXYCODONE-ACETAMINOPHEN 5-325 MG PO TABS: 1.0000 | ORAL_TABLET | ORAL | Status: DC | PRN

## 2013-09-18 NOTE — Discharge Summary (Signed)
Obstetric Discharge Summary Reason for Admission: cesarean section Prenatal Procedures: ultrasound Intrapartum Procedures: cesarean: low cervical, transverse Postpartum Procedures: none Complications-Operative and Postpartum: none Hemoglobin  Date Value Range Status  09/16/2013 12.5  12.0 - 15.0 g/dL Final     HCT  Date Value Range Status  09/16/2013 36.5  36.0 - 46.0 % Final    Physical Exam:  General: alert and cooperative Lochia: appropriate Uterine Fundus: firm Incision: erythema noted superior to incision DVT Evaluation: No evidence of DVT seen on physical exam. Negative Homan's sign. No cords or calf tenderness. Calf/Ankle edema is present.  Discharge Diagnoses: Term Pregnancy-delivered  Discharge Information: Date: 09/18/2013 Activity: pelvic rest Diet: routine Medications: PNV, Ibuprofen, Percocet and keflex Condition: stable Instructions: refer to practice specific booklet Discharge to: home   Newborn Data: Live born female  Birth Weight: 10 lb 5.4 oz (4689 g) APGAR: 8, 8  Home with mother.  Felix Meras G 09/18/2013, 8:24 AM

## 2013-09-18 NOTE — Lactation Note (Signed)
This note was copied from the chart of Ann Corra McCamish. Lactation Consultation Note; Follow up visit with mom before DC. Dr Burt Knack has asked [place] to show mom SNS to supplement until her milk comes in due to weight loss of 10%.  Mom reports that baby has been feeding off and on since 7:30 am. Just finished feeding for 20 minutes. Baby asleep on mom's chest. Mom instructed in use and cleaning of syringe/ feeding tube to supplement while at the breast. Mom verbalizes understanding. Reviewed BFSG and OP appointments as resources for support after DC. No questions at present. To call for assist if baby feeds again before DC.   Patient Name: Ann Gonzalez [id number] Date: 09/18/2013 Reason for consult: Follow-up assessment   Maternal Data    Feeding    LATCH Score/Interventions          Comfort (Breast/Nipple): Filling, red/small blisters or bruises, mild/mod discomfort  Problem noted: Mild/Moderate discomfort Interventions (Mild/moderate discomfort): Comfort gels        Lactation Tools Discussed/Used     Consult Status Consult Status: Complete    Truddie Crumble 09/18/2013, 9:57 AM

## 2013-10-28 ENCOUNTER — Ambulatory Visit (HOSPITAL_COMMUNITY)
Admission: RE | Admit: 2013-10-28 | Discharge: 2013-10-28 | Disposition: A | Discharge status: Home / Self Care | Payer: BC Managed Care – PPO | Source: Ambulatory Visit | Attending: Obstetrics and Gynecology | Admitting: Obstetrics and Gynecology

## 2013-10-30 NOTE — Lactation Note (Signed)
Adult Lactation Consultation Outpatient Visit Note  Patient Name: Ann Gonzalez       BABY: ELIZABETH [place] Date of Birth: September 19, 1975                          DOB: [birth year] Gestational Age at Delivery: 39.6            BIRTH WEIGHT: 10-5.4 Type of Delivery: C/S                               DISCHARGE WEIGHT: 9-4.7                                                                   WEIGHT TODAY: 12-5.4 Breastfeeding History: Frequency of Breastfeeding: NONE IN PAST WEEK Length of Feeding:  Voids: QS Stools: QS  Supplementing / Method:EBM 4-5 OZ EVERY 3 HOURS DURING THE DAY AND EVERY 4-5 HOURS AT NIGHT PER BOTTLE Pumping:  Type of Pump:MEDELA PUMP IN STYLE   Frequency:EVERY 3 HOURS  Volume:  4-8 OZ  Comments:    Consultation Evaluation: Mom and 35 week old baby here for feeding assessment.  Mom states she stopped putting baby to the breast 1 week ago and started pumping and bottle feeding due to cracked, bloody nipple and short feedings at breast.  Mom has been worried baby becomes fussy at breast due to low milk supply but she is pumping very good amounts.  Observed baby latch easily and well to breast.  Mom has a fast and forceful letdown and baby comes off some and pulls back to handle to the flow.  Discussed feeding reclining back, positioning baby in a sitting position and pre pumping to assist baby in handling flow of milk.  Answered questions and reassured mom that her milk supply is very good.  Instructed to keep baby close during feeding to avoid sore nipples.  Encouraged to call with further concerns and attend breastfeeding support group when possible.  Initial Feeding Assessment:RIGHT BREAST 25 MINUTES Pre-feed Weight:5598 Post-feed OACZYS:0630 Amount Transferred:78 MLS Comments:  Additional Feeding Assessment:LEFT BREAST 7 MINUTES Pre-feed ZSWFUX:3235 Post-feed TDDUKG:2542 Amount Transferred:22 MLS Comments:  Additional Feeding Assessment: Pre-feed Weight: Post-feed  Weight: Amount Transferred: Comments:  Total Breast milk Transferred this Visit: 100 MLS Total Supplement Given: NONE  Additional Interventions:   Follow-Up WILL CALL PRN AND ATTEND SUPPORT GROUP       Franki Monte 10/30/2013, 10:32 AM

## 2014-02-25 ENCOUNTER — Encounter: Payer: Self-pay | Admitting: Physician Assistant

## 2014-06-29 ENCOUNTER — Encounter (HOSPITAL_COMMUNITY): Payer: Self-pay | Admitting: Obstetrics and Gynecology

## 2014-06-30 DIAGNOSIS — N979 Female infertility, unspecified: Secondary | ICD-10-CM | POA: Insufficient documentation

## 2014-09-09 DIAGNOSIS — K219 Gastro-esophageal reflux disease without esophagitis: Secondary | ICD-10-CM | POA: Insufficient documentation

## 2014-09-10 ENCOUNTER — Encounter: Payer: Self-pay | Admitting: Internal Medicine

## 2014-09-10 ENCOUNTER — Ambulatory Visit (INDEPENDENT_AMBULATORY_CARE_PROVIDER_SITE_OTHER): Payer: BLUE CROSS/BLUE SHIELD | Admitting: Internal Medicine

## 2014-09-10 VITALS — BP 132/82 | HR 80 | Temp 98.1°F | Resp 16 | Ht 68.0 in | Wt 170.4 lb

## 2014-09-10 DIAGNOSIS — M940 Chondrocostal junction syndrome [Tietze]: Secondary | ICD-10-CM

## 2014-09-10 MED ORDER — MELOXICAM 15 MG PO TABS: ORAL_TABLET | ORAL | Status: AC

## 2014-09-10 MED ORDER — PREDNISONE 20 MG PO TABS: ORAL_TABLET | ORAL | Status: DC

## 2014-09-10 NOTE — Patient Instructions (Signed)
Vitamin D 5,000 units  2 x day  Calcium 500 - 600 mg / daily  Coated  Low dose aspirin 81 mg ++++++++++++++++++++++++++++++++++++  Costochondritis Costochondritis, sometimes called Tietze syndrome, is a swelling and irritation (inflammation) of the tissue (cartilage) that connects your ribs with your breastbone (sternum). It causes pain in the chest and rib area. Costochondritis usually goes away on its own over time. It can take up to 6 weeks or longer to get better, especially if you are unable to limit your activities. CAUSES  Some cases of costochondritis have no known cause. Possible causes include:  Injury (trauma).  Exercise or activity such as lifting.  Severe coughing. SIGNS AND SYMPTOMS  Pain and tenderness in the chest and rib area.  Pain that gets worse when coughing or taking deep breaths.  Pain that gets worse with specific movements. DIAGNOSIS  Your health care provider will do a physical exam and ask about your symptoms. Chest X-rays or other tests may be done to rule out other problems. TREATMENT  Costochondritis usually goes away on its own over time. Your health care provider may prescribe medicine to help relieve pain. HOME CARE INSTRUCTIONS   Avoid exhausting physical activity. Try not to strain your ribs during normal activity. This would include any activities using chest, abdominal, and side muscles, especially if heavy weights are used.  Apply ice to the affected area for the first 2 days after the pain begins.  Put ice in a plastic bag.  Place a towel between your skin and the bag.  Leave the ice on for 20 minutes, 2-3 times a day.  Only take over-the-counter or prescription medicines as directed by your health care provider. SEEK MEDICAL CARE IF:  You have redness or swelling at the rib joints. These are signs of infection.  Your pain does not go away despite rest or medicine. SEEK IMMEDIATE MEDICAL CARE IF:   Your pain increases or you are  very uncomfortable.  You have shortness of breath or difficulty breathing.  You cough up blood.  You have worse chest pains, sweating, or vomiting.  You have a fever or persistent symptoms for more than 2-3 days.  You have a fever and your symptoms suddenly get worse. MAKE SURE YOU:   Understand these instructions.  Will watch your condition.  Will get help right away if you are not doing well or get worse. Document Released: 05/24/2005 Document Revised: 06/04/2013 Document Reviewed: 03/18/2013 [facility] Patient Information 2015 [place], [place]. This information is not intended to replace advice given to you by your health care provider. Make sure you discuss any questions you have with your health care provider.      Recommend the book "The END of DIETING" by Dr Baker Janus   and the book "The END of DIABETES " by Dr Excell Seltzer  At [email or web address] - get book & Audio CD's      Being diabetic has a  300% increased risk for heart attack, stroke, cancer, and alzheimer- type vascular dementia. It is very important that you work harder with diet by avoiding all foods that are white except chicken & fish. Avoid white rice (brown & wild rice is OK), white potatoes (sweetpotatoes in moderation is OK), White bread or wheat bread or anything made out of white flour like bagels, donuts, rolls, buns, biscuits, cakes, pastries, cookies, pizza crust, and pasta (made from white flour & egg whites) - vegetarian pasta or spinach or wheat pasta is OK. Multigrain breads  like Arnold's or [employer], or multigrain sandwich thins or flatbreads.  Diet, exercise and weight loss can reverse and cure diabetes in the early stages.  Diet, exercise and weight loss is very important in the control and prevention of complications of diabetes which affects every system in your body, ie. Brain - dementia/stroke, eyes - glaucoma/blindness, heart - heart attack/heart failure, kidneys - dialysis, stomach - gastric  paralysis, intestines - malabsorption, nerves - severe painful neuritis, circulation - gangrene & loss of a leg(s), and finally cancer and Alzheimers.    I recommend avoid fried & greasy foods,  sweets/candy, white rice (brown or wild rice or Quinoa is OK), white potatoes (sweet potatoes are OK) - anything made from white flour - bagels, doughnuts, rolls, buns, biscuits,white and wheat breads, pizza crust and traditional pasta made of white flour & egg white(vegetarian pasta or spinach or wheat pasta is OK).  Multi-grain bread is OK - like multi-grain flat bread or sandwich thins. Avoid alcohol in excess. Exercise is also important.    Eat all the vegetables you want - avoid meat, especially red meat and dairy - especially cheese.  Cheese is the most concentrated form of trans-fats which is the worst thing to clog up our arteries. Veggie cheese is OK which can be found in the fresh produce section at [facility] or [employer] or [employer]

## 2014-09-12 NOTE — Progress Notes (Signed)
   Subjective:    Patient ID: Ann Gonzalez, female    DOB: Sep 30, 1975, 39 y.o.   MRN: [id number]  HPI  Present w/ 1 week hx/o parasternal chest soreness noted at rest and no effort associated component. No resp sx's as congestion, dyspnea, cough & & no fever or chills. No CV sx's as HA, dizziness , CP, palpitations or exertional or pleuritic CP or edema. Tried ibuprofen with some relief.  Medication Sig  . cephALEXin (KEFLEX) 500 MG capsule Take 1 capsule (500 mg total) by mouth every 6 (six) hours.  . docusate sodium (COLACE) 100 MG capsule Take 100 mg by mouth daily.  . hydrocortisone cream 1 % Apply topically 3 (three) times daily.  Marland Kitchen ibuprofen (ADVIL,MOTRIN) 600 MG tablet Take 1 tablet (600 mg total) by mouth every 6 (six) hours as needed for mild pain.  Marland Kitchen oxyCODONE-acetaminophen (PERCOCET/ROXICET) 5-325 MG per tablet Take 1-2 tablets by mouth every 4 (four) hours as needed for severe pain (moderate - severe pain).  . Prenatal Vit-Fe Fumarate-FA (PRENATAL MULTIVITAMIN) TABS tablet Take 1 tablet by mouth daily at 12 noon.   No Known Allergies   Past Medical History  Diagnosis Date  . HSV (herpes simplex virus) infection   . Newborn product of IVF pregnancy   . GERD (gastroesophageal reflux disease)     during pregnancy  . Headache(784.0)     migraines as teenager  . Dysplasia of cervix    Past Surgical History  Procedure Laterality Date  . Wisdom tooth extraction    . Septoplasty    . Cryoablation    . Embryo transfer    . Cesarean section N/A 09/15/2013    Procedure: CESAREAN SECTION;  Surgeon: Marylynn Pearson, MD;  Location: [facility];  Service: Obstetrics;  Laterality: N/A;  PRIMARY EDC 1/20   Review of Systems  Non contributory and 10 pt Systems review Negative to above     Objective:   Physical Exam BP 132/82   Pulse 80  Temp 98.1 F  Resp 16  Ht 5\' 8"   Wt 170 lb 6.4 oz    BMI 25.92   HEENT - Eac's patent. TM's Nl. EOM's full. PERRLA. NasoOroPharynx clear. Neck -  supple. Nl Thyroid. Carotids 2+ & No bruits, nodes, JVD Chest - Clear equal BS w/o Rales, rhonchi, wheezes.Tender parasternal CC jt's - Lt>Rt.  Cor - Nl HS. RRR w/o sig MGR. PP 1(+). No edema. Abd - No palpable organomegaly, masses or tenderness. BS nl. MS- FROM w/o deformities. Muscle power, tone and bulk Nl. Gait Nl. Neuro - No obvious Cr N abnormalities. Sensory, motor and Cerebellar functions appear Nl w/o focal abnormalities. Psyche - Mental status normal & appropriate.      Assessment & Plan:   1. Costochondritis  - Rx- Prednisone 20 mg #20 pulse/taper, then if needed  - meloxicam (MOBIC) 15 MG tablet; Take 1/2 to 1 tqablet daily with food for pain & inflammation  Dispense: 90 tablet; Refill: 1 -Recc try heating pad

## 2014-10-10 NOTE — Addendum Note (Signed)
Addended by: Othman Masur A on: 10/10/2014 08:56 AM   Modules accepted: Orders

## 2014-10-23 ENCOUNTER — Ambulatory Visit: Payer: Self-pay | Admitting: Internal Medicine

## 2014-11-25 ENCOUNTER — Ambulatory Visit (INDEPENDENT_AMBULATORY_CARE_PROVIDER_SITE_OTHER): Payer: BC Managed Care – PPO | Admitting: Internal Medicine

## 2014-11-25 ENCOUNTER — Encounter: Payer: Self-pay | Admitting: Internal Medicine

## 2014-11-25 VITALS — BP 114/70 | HR 80 | Temp 97.5°F | Resp 16 | Ht 68.0 in | Wt 164.6 lb

## 2014-11-25 DIAGNOSIS — Z79899 Other long term (current) drug therapy: Secondary | ICD-10-CM

## 2014-11-25 DIAGNOSIS — E559 Vitamin D deficiency, unspecified: Secondary | ICD-10-CM

## 2014-11-25 DIAGNOSIS — R7303 Prediabetes: Secondary | ICD-10-CM

## 2014-11-25 DIAGNOSIS — R5383 Other fatigue: Secondary | ICD-10-CM

## 2014-11-25 DIAGNOSIS — M940 Chondrocostal junction syndrome [Tietze]: Secondary | ICD-10-CM

## 2014-11-25 DIAGNOSIS — R7309 Other abnormal glucose: Secondary | ICD-10-CM

## 2014-11-25 DIAGNOSIS — E782 Mixed hyperlipidemia: Secondary | ICD-10-CM

## 2014-11-25 HISTORY — DX: Chondrocostal junction syndrome (tietze): M94.0

## 2014-11-25 HISTORY — DX: Prediabetes: R73.03

## 2014-11-25 LAB — CBC WITH DIFFERENTIAL/PLATELET
Basophils Absolute: 0 10*3/uL (ref 0.0–0.1)
Basophils Relative: 0 % (ref 0–1)
EOS PCT: 1 % (ref 0–5)
Eosinophils Absolute: 0.1 10*3/uL (ref 0.0–0.7)
HCT: 44.5 % (ref 36.0–46.0)
Hemoglobin: 14.8 g/dL (ref 12.0–15.0)
LYMPHS ABS: 2 10*3/uL (ref 0.7–4.0)
LYMPHS PCT: 29 % (ref 12–46)
MCH: 30.5 pg (ref 26.0–34.0)
MCHC: 33.3 g/dL (ref 30.0–36.0)
MCV: 91.6 fL (ref 78.0–100.0)
MONOS PCT: 8 % (ref 3–12)
MPV: 11.2 fL (ref 8.6–12.4)
Monocytes Absolute: 0.6 10*3/uL (ref 0.1–1.0)
NEUTROS ABS: 4.3 10*3/uL (ref 1.7–7.7)
NEUTROS PCT: 62 % (ref 43–77)
Platelets: 314 10*3/uL (ref 150–400)
RBC: 4.86 MIL/uL (ref 3.87–5.11)
RDW: 13.3 % (ref 11.5–15.5)
WBC: 7 10*3/uL (ref 4.0–10.5)

## 2014-11-25 LAB — LIPID PANEL
CHOL/HDL RATIO: 5.1 ratio
CHOLESTEROL: 194 mg/dL (ref 0–200)
HDL: 38 mg/dL — ABNORMAL LOW (ref >=46)
LDL Cholesterol: 134 mg/dL — ABNORMAL HIGH (ref 0–99)
TRIGLYCERIDES: 111 mg/dL (ref <150)
VLDL: 22 mg/dL (ref 0–40)

## 2014-11-25 LAB — BASIC METABOLIC PANEL WITH GFR
BUN: 10 mg/dL (ref 6–23)
CO2: 28 mEq/L (ref 19–32)
Calcium: 10.1 mg/dL (ref 8.4–10.5)
Chloride: 104 mEq/L (ref 96–112)
Creat: 0.66 mg/dL (ref 0.50–1.10)
GFR, Est African American: >89 mL/min
GFR, Est Non African American: >89 mL/min
Glucose, Bld: 89 mg/dL (ref 70–99)
Potassium: 4.9 mEq/L (ref 3.5–5.3)
Sodium: 137 mEq/L (ref 135–145)

## 2014-11-25 LAB — MAGNESIUM: Magnesium: 2.1 mg/dL (ref 1.5–2.5)

## 2014-11-25 LAB — TSH: TSH: 0.942 u[IU]/mL (ref 0.350–4.500)

## 2014-11-25 LAB — HEPATIC FUNCTION PANEL
ALBUMIN: 4.6 g/dL (ref 3.5–5.2)
ALT: 16 U/L (ref 0–35)
AST: 12 U/L (ref 0–37)
Alkaline Phosphatase: 73 U/L (ref 39–117)
BILIRUBIN DIRECT: 0.1 mg/dL (ref 0.0–0.3)
Indirect Bilirubin: 0.5 mg/dL (ref 0.2–1.2)
TOTAL PROTEIN: 7.4 g/dL (ref 6.0–8.3)
Total Bilirubin: 0.6 mg/dL (ref 0.2–1.2)

## 2014-11-25 LAB — HEMOGLOBIN A1C
Hgb A1c MFr Bld: 5.8 % — ABNORMAL HIGH (ref <5.7)
MEAN PLASMA GLUCOSE: 120 mg/dL — AB (ref <117)

## 2014-11-25 NOTE — Patient Instructions (Signed)

## 2014-11-25 NOTE — Progress Notes (Signed)
   Subjective:    Patient ID: Ann Gonzalez, female    DOB: Oct 08, 1975, 39 y.o.   MRN: [id number]  HPI Patient was seen about 2 months ago & tx'd with Mobic for her costochondritis and never filled the Rx Prednisone that she was given. In the interim it sounds as though she has a bout with the Noro virus with 2-3 days of violent N/V & diarrhea. Also husb & her 53&93/37 year old child were ill.    In the past she was noted to lave a very low Vit D 18 in 2011 and an elevated A1c 5.9% -also in 2011. She requests to hav these rechecked today as well as her cholesterol.  Medication Sig  . Multiple Vitamin (MULTI-VITAMINS) TABS Take by mouth.  . predniSONE (DELTASONE) 20 MG tablet Taper - 20 tablet  . meloxicam (MOBIC) 15 MG tablet Take 1/2 to 1 tqablet daily with food for pain & inflammation (Patient not taking: Reported on 11/25/2014)  . acyclovir (ZOVIRAX) 200 MG/5ML suspension Take by mouth.   No Known Allergies   Past Medical History  Diagnosis Date  . HSV (herpes simplex virus) infection   . Newborn product of IVF pregnancy   . GERD (gastroesophageal reflux disease)     during pregnancy  . Headache(784.0)     migraines as teenager  . Dysplasia of cervix    Past Surgical History  Procedure Laterality Date  . Wisdom tooth extraction    . Septoplasty    . Cryoablation    . Embryo transfer    . Cesarean section N/A 09/15/2013    Procedure: CESAREAN SECTION;  Surgeon: Marylynn Pearson, MD;  Location: [facility];  Service: Obstetrics;  Laterality: N/A;  PRIMARY EDC 1/20   Review of Systems  The patient denies cough, chest pain, dyspnea, wheezing or hemoptysis.    Objective:   Physical Exam  BP 114/70 mmHg  Pulse 80  Temp(Src) 97.5 F (36.4 C)  Resp 16  Ht 5\' 8"  (1.727 m)  Wt 164 lb 9.6 oz (74.662 kg)  BMI 25.03 kg/m2  HEENT - Eac's patent. TM's Nl. EOM's full. PERRLA. NasoOroPharynx clear. Neck - supple. Nl Thyroid. Carotids 2+ & No bruits, nodes, JVD Chest - Clear equal BS w/o  Rales, rhonchi, wheezes. Cor - Nl HS. RRR w/o sig MGR. PP 1(+). No edema. Chest - mstil slight parasternal CCj5 MS- FROM w/o deformities. Muscle power, tone and bulk Nl. Gait Nl. Neuro - No obvious Cr N abnormalities. Sensory, motor and Cerebellar functions appear Nl w/o focal abnormalities. Psyche - Mental status normal & appropriate.  No delusions, ideations or obvious mood abnormalities.    Assessment & Plan:   1. Costochondritis -  -recc use the Meloxicam prn and may try heating pad  2. Vitamin D deficiency  - Vit D  25 hydroxy   3. Prediabetes  - Hemoglobin A1c - Insulin, random  4. Mixed hyperlipidemia  - Lipid panel  5. Other fatigue  - TSH  6. Medication management  - Urine Microscopic - CBC with Differential/Platelet - BASIC METABOLIC PANEL WITH GFR - Hepatic function panel - Magnesium

## 2014-11-26 LAB — INSULIN, RANDOM: Insulin: 7.8 u[IU]/mL (ref 2.0–19.6)

## 2014-11-26 LAB — VITAMIN D 25 HYDROXY (VIT D DEFICIENCY, FRACTURES): Vit D, 25-Hydroxy: 38 ng/mL (ref 30–100)

## 2014-12-24 ENCOUNTER — Other Ambulatory Visit: Payer: Self-pay

## 2014-12-25 LAB — CYTOLOGY - PAP

## 2015-12-09 ENCOUNTER — Ambulatory Visit (INDEPENDENT_AMBULATORY_CARE_PROVIDER_SITE_OTHER): Payer: BC Managed Care – PPO | Admitting: Internal Medicine

## 2015-12-09 ENCOUNTER — Encounter: Payer: Self-pay | Admitting: Internal Medicine

## 2015-12-09 VITALS — BP 112/72 | HR 72 | Temp 97.5°F | Resp 16 | Ht 68.0 in | Wt 163.2 lb

## 2015-12-09 DIAGNOSIS — R030 Elevated blood-pressure reading, without diagnosis of hypertension: Secondary | ICD-10-CM | POA: Diagnosis not present

## 2015-12-09 DIAGNOSIS — Z0000 Encounter for general adult medical examination without abnormal findings: Secondary | ICD-10-CM | POA: Diagnosis not present

## 2015-12-09 DIAGNOSIS — Z79899 Other long term (current) drug therapy: Secondary | ICD-10-CM | POA: Diagnosis not present

## 2015-12-09 DIAGNOSIS — Z136 Encounter for screening for cardiovascular disorders: Secondary | ICD-10-CM

## 2015-12-09 DIAGNOSIS — E559 Vitamin D deficiency, unspecified: Secondary | ICD-10-CM

## 2015-12-09 DIAGNOSIS — Z0001 Encounter for general adult medical examination with abnormal findings: Secondary | ICD-10-CM

## 2015-12-09 DIAGNOSIS — IMO0001 Reserved for inherently not codable concepts without codable children: Secondary | ICD-10-CM

## 2015-12-09 DIAGNOSIS — I10 Essential (primary) hypertension: Secondary | ICD-10-CM | POA: Insufficient documentation

## 2015-12-09 DIAGNOSIS — R7303 Prediabetes: Secondary | ICD-10-CM

## 2015-12-09 DIAGNOSIS — R5383 Other fatigue: Secondary | ICD-10-CM

## 2015-12-09 DIAGNOSIS — Z111 Encounter for screening for respiratory tuberculosis: Secondary | ICD-10-CM | POA: Diagnosis not present

## 2015-12-09 DIAGNOSIS — Z1212 Encounter for screening for malignant neoplasm of rectum: Secondary | ICD-10-CM

## 2015-12-09 DIAGNOSIS — J0140 Acute pansinusitis, unspecified: Secondary | ICD-10-CM

## 2015-12-09 DIAGNOSIS — E782 Mixed hyperlipidemia: Secondary | ICD-10-CM

## 2015-12-09 LAB — BASIC METABOLIC PANEL WITH GFR
BUN: 10 mg/dL (ref 7–25)
CHLORIDE: 105 mmol/L (ref 98–110)
CO2: 24 mmol/L (ref 20–31)
CREATININE: 0.76 mg/dL (ref 0.50–1.10)
Calcium: 9.3 mg/dL (ref 8.6–10.2)
GFR, Est African American: >89 mL/min (ref >=60)
GFR, Est Non African American: >89 mL/min (ref >=60)
Glucose, Bld: 79 mg/dL (ref 65–99)
Potassium: 4.1 mmol/L (ref 3.5–5.3)
SODIUM: 141 mmol/L (ref 135–146)

## 2015-12-09 LAB — CBC WITH DIFFERENTIAL/PLATELET
BASOS ABS: 0 {cells}/uL (ref 0–200)
Basophils Relative: 0 %
EOS ABS: 68 {cells}/uL (ref 15–500)
EOS PCT: 1 %
HCT: 42.7 % (ref 35.0–45.0)
HEMOGLOBIN: 14.5 g/dL (ref 11.7–15.5)
LYMPHS ABS: 1972 {cells}/uL (ref 850–3900)
Lymphocytes Relative: 29 %
MCH: 33 pg (ref 27.0–33.0)
MCHC: 34 g/dL (ref 32.0–36.0)
MCV: 97.3 fL (ref 80.0–100.0)
MPV: 10.7 fL (ref 7.5–12.5)
Monocytes Absolute: 612 cells/uL (ref 200–950)
Monocytes Relative: 9 %
NEUTROS ABS: 4148 {cells}/uL (ref 1500–7800)
Neutrophils Relative %: 61 %
Platelets: 287 10*3/uL (ref 140–400)
RBC: 4.39 MIL/uL (ref 3.80–5.10)
RDW: 12.7 % (ref 11.0–15.0)
WBC: 6.8 10*3/uL (ref 3.8–10.8)

## 2015-12-09 LAB — LIPID PANEL
CHOL/HDL RATIO: 3.5 ratio (ref <=5.0)
Cholesterol: 156 mg/dL (ref 125–200)
HDL: 44 mg/dL — ABNORMAL LOW (ref >=46)
LDL CALC: 97 mg/dL (ref <130)
Triglycerides: 76 mg/dL (ref <150)
VLDL: 15 mg/dL (ref <30)

## 2015-12-09 LAB — HEPATIC FUNCTION PANEL
ALK PHOS: 66 U/L (ref 33–115)
ALT: 10 U/L (ref 6–29)
AST: 12 U/L (ref 10–30)
Albumin: 4.4 g/dL (ref 3.6–5.1)
BILIRUBIN DIRECT: 0.1 mg/dL (ref <=0.2)
Indirect Bilirubin: 0.2 mg/dL (ref 0.2–1.2)
Total Bilirubin: 0.3 mg/dL (ref 0.2–1.2)
Total Protein: 7 g/dL (ref 6.1–8.1)

## 2015-12-09 LAB — IRON AND TIBC
%SAT: 9 % — AB (ref 11–50)
Iron: 30 ug/dL — ABNORMAL LOW (ref 40–190)
TIBC: 348 ug/dL (ref 250–450)
UIBC: 318 ug/dL (ref 125–400)

## 2015-12-09 LAB — TSH: TSH: 0.84 mIU/L

## 2015-12-09 LAB — MAGNESIUM: MAGNESIUM: 1.9 mg/dL (ref 1.5–2.5)

## 2015-12-09 LAB — VITAMIN B12: Vitamin B-12: 539 pg/mL (ref 200–1100)

## 2015-12-09 MED ORDER — AZITHROMYCIN 250 MG PO TABS: ORAL_TABLET | ORAL | Status: DC

## 2015-12-09 MED ORDER — PREDNISONE 20 MG PO TABS: ORAL_TABLET | ORAL | Status: DC

## 2015-12-09 NOTE — Patient Instructions (Signed)
Recommend Adult Low Dose Aspirin or   coated  Aspirin 81 mg daily   To reduce risk of Colon Cancer 20 %,   Skin Cancer 26 % ,   Melanoma 46%   and   Pancreatic cancer 60%   ++++++++++++++++++++++++++++++++++++++++++++++++++++++ Vitamin D goal   is between 70-100.   Please make sure that you are taking your Vitamin D as directed.   It is very important as a natural anti-inflammatory   helping hair, skin, and nails, as well as reducing stroke and heart attack risk.   It helps your bones and helps with mood.  It also decreases numerous cancer risks so please take it as directed.   Low Vit D is associated with a 200-300% higher risk for CANCER   and 200-300% higher risk for HEART   ATTACK  &  STROKE.   .....................................Marland Kitchen  It is also associated with higher death rate at younger ages,   autoimmune diseases like Rheumatoid arthritis, Lupus, Multiple Sclerosis.     Also many other serious conditions, like depression, Alzheimer's  Dementia, infertility, muscle aches, fatigue, fibromyalgia - just to name a few.  ++++++++++++++++++++++++++++++++++++++++++++++++  Recommend the book "The END of DIETING" by Dr Excell Seltzer   & the book "The END of DIABETES " by Dr Excell Seltzer  At [email or web address] - get book & Audio CD's     Being diabetic has a  300% increased risk for heart attack, stroke, cancer, and alzheimer- type vascular dementia. It is very important that you work harder with diet by avoiding all foods that are white. Avoid white rice (brown & wild rice is OK), white potatoes (sweetpotatoes in moderation is OK), White bread or wheat bread or anything made out of white flour like bagels, donuts, rolls, buns, biscuits, cakes, pastries, cookies, pizza crust, and pasta (made from white flour & egg whites) - vegetarian pasta or spinach or wheat pasta is OK. Multigrain breads like Arnold's or Pepper[employer], or multigrain sandwich thins or flatbreads.  Diet,  exercise and weight loss can reverse and cure diabetes in the early stages.  Diet, exercise and weight loss is very important in the control and prevention of complications of diabetes which affects every system in your body, ie. Brain - dementia/stroke, eyes - glaucoma/blindness, heart - heart attack/heart failure, kidneys - dialysis, stomach - gastric paralysis, intestines - malabsorption, nerves - severe painful neuritis, circulation - gangrene & loss of a leg(s), and finally cancer and Alzheimers.    I recommend avoid fried & greasy foods,  sweets/candy, white rice (brown or wild rice or Quinoa is OK), white potatoes (sweet potatoes are OK) - anything made from white flour - bagels, doughnuts, rolls, buns, biscuits,white and wheat breads, pizza crust and traditional pasta made of white flour & egg white(vegetarian pasta or spinach or wheat pasta is OK).  Multi-grain bread is OK - like multi-grain flat bread or sandwich thins. Avoid alcohol in excess. Exercise is also important.    Eat all the vegetables you want - avoid meat, especially red meat and dairy - especially cheese.  Cheese is the most concentrated form of trans-fats which is the worst thing to clog up our arteries. Veggie cheese is OK which can be found in the fresh produce section at [employer] or [employer] or [employer]  ++++++++++++++++++++++++++++++++++++++++++++++++++ DASH Eating Plan  DASH stands for "Dietary Approaches to Stop Hypertension."   The DASH eating plan is a healthy eating plan that has been shown to reduce high blood  pressure (hypertension). Additional health benefits may include reducing the risk of type 2 diabetes mellitus, heart disease, and stroke. The DASH eating plan may also help with weight loss.  WHAT DO I NEED TO KNOW ABOUT THE DASH EATING PLAN?  For the DASH eating plan, you will follow these general guidelines:  Choose foods with a percent daily value for sodium of less than 5% (as listed on the food  label).  Use salt-free seasonings or herbs instead of table salt or sea salt.  Check with your health care provider or pharmacist before using salt substitutes.  Eat lower-sodium products, often labeled as "lower sodium" or "no salt added."  Eat fresh foods.  Eat more vegetables, fruits, and low-fat dairy products.    Choose whole grains. Look for the word "whole" as the first word in the ingredient list.  Choose fish   Limit sweets, desserts, sugars, and sugary drinks.  Choose heart-healthy fats.  Eat veggie cheese   Eat more home-cooked food and less restaurant, buffet, and fast food.  Limit fried foods.  [place]uffaker foods using methods other than frying.  Limit canned vegetables. If you do use them, rinse them well to decrease the sodium.  When eating at a restaurant, ask that your food be prepared with less salt, or no salt if possible.                      WHAT FOODS CAN I EAT?  Read Dr Fara Olden Fuhrman's books on The End of Dieting & The End of Diabetes  Grains  Whole grain or whole wheat bread. Brown rice. Whole grain or whole wheat pasta. Quinoa, bulgur, and whole grain cereals. Low-sodium cereals. Corn or whole wheat flour tortillas. Whole grain cornbread. Whole grain crackers. Low-sodium crackers.  Vegetables  Fresh or frozen vegetables (raw, steamed, roasted, or grilled). Low-sodium or reduced-sodium tomato and vegetable juices. Low-sodium or reduced-sodium tomato sauce and paste. Low-sodium or reduced-sodium canned vegetables.   Fruits  All fresh, canned (in natural juice), or frozen fruits.  Protein Products   All fish and seafood.  Dried beans, peas, or lentils. Unsalted nuts and seeds. Unsalted canned beans.  Dairy  Low-fat dairy products, such as skim or 1% milk, 2% or reduced-fat cheeses, low-fat ricotta or cottage cheese, or plain low-fat yogurt. Low-sodium or reduced-sodium cheeses.  Fats and Oils  Tub margarines without trans fats. Light or  reduced-fat mayonnaise and salad dressings (reduced sodium). Avocado. Safflower, olive, or canola oils. Natural peanut or almond butter.  Other  Unsalted popcorn and pretzels. The items listed above may not be a complete list of recommended foods or beverages. Contact your dietitian for more options.  +++++++++++++++++++++++++++++++++++++++++++  WHAT FOODS ARE NOT RECOMMENDED?  Grains/ White flour or wheat flour  White bread. White pasta. White rice. Refined cornbread. Bagels and croissants. Crackers that contain trans fat.  Vegetables  Creamed or fried vegetables. Vegetables in a . Regular canned vegetables. Regular canned tomato sauce and paste. Regular tomato and vegetable juices.  Fruits  Dried fruits. Canned fruit in light or heavy syrup. Fruit juice.  Meat and Other Protein Products  Meat in general - RED mwaet & White meat.  Fatty cuts of meat. Ribs, chicken wings, bacon, sausage, bologna, salami, chitterlings, fatback, hot dogs, bratwurst, and packaged luncheon meats.  Dairy  Whole or 2% milk, cream, half-and-half, and cream cheese. Whole-fat or sweetened yogurt. Full-fat cheeses or blue cheese. Nondairy creamers and whipped toppings. Processed cheese, cheese spreads, or  cheese curds.  Condiments  Onion and garlic salt, seasoned salt, table salt, and sea salt. Canned and packaged gravies. Worcestershire sauce. Tartar sauce. Barbecue sauce. Teriyaki sauce. Soy sauce, including reduced sodium. Steak sauce. Fish sauce. Oyster sauce. Cocktail sauce. Horseradish. Ketchup and mustard. Meat flavorings and tenderizers. Bouillon cubes. Hot sauce. Tabasco sauce. Marinades. Taco seasonings. Relishes.  Fats and Oils Butter, stick margarine, lard, shortening and bacon fat. Coconut, palm kernel, or palm oils. Regular salad dressings.  Pickles and olives. Salted popcorn and pretzels.  The items listed above may not be a complete list of foods and beverages to avoid.   Preventive  Care for Adults  A healthy lifestyle and preventive care can promote health and wellness. Preventive health guidelines for women include the following key practices.  A routine yearly physical is a good way to check with your health care provider about your health and preventive screening. It is a chance to share any concerns and updates on your health and to receive a thorough exam.  Visit your dentist for a routine exam and preventive care every 6 months. Brush your teeth twice a day and floss once a day. Good oral hygiene prevents tooth decay and gum disease.  The frequency of eye exams is based on your age, health, family medical history, use of contact lenses, and other factors. Follow your health care provider's recommendations for frequency of eye exams.  Eat a healthy diet. Foods like vegetables, fruits, whole grains, low-fat dairy products, and lean protein foods contain the nutrients you need without too many calories. Decrease your intake of foods high in solid fats, added sugars, and salt. Eat the right amount of calories for you.Get information about a proper diet from your health care provider, if necessary.  Regular physical exercise is one of the most important things you can do for your health. Most adults should get at least 150 minutes of moderate-intensity exercise (any activity that increases your heart rate and causes you to sweat) each week. In addition, most adults need muscle-strengthening exercises on 2 or more days a week.  Maintain a healthy weight. The body mass index (BMI) is a screening tool to identify possible weight problems. It provides an estimate of body fat based on height and weight. Your health care provider can find your BMI and can help you achieve or maintain a healthy weight.For adults 20 years and older:  A BMI below 18.5 is considered underweight.  A BMI of 18.5 to 24.9 is normal.  A BMI of 25 to 29.9 is considered overweight.  A BMI of 30 and  above is considered obese.  Maintain normal blood lipids and cholesterol levels by exercising and minimizing your intake of saturated fat. Eat a balanced diet with plenty of fruit and vegetables. Blood tests for lipids and cholesterol should begin at age 21 and be repeated every 5 years. If your lipid or cholesterol levels are high, you are over 50, or you are at high risk for heart disease, you may need your cholesterol levels checked more frequently.Ongoing high lipid and cholesterol levels should be treated with medicines if diet and exercise are not working.  If you smoke, find out from your health care provider how to quit. If you do not use tobacco, do not start.  Lung cancer screening is recommended for adults aged 31-80 years who are at high risk for developing lung cancer because of a history of smoking. A yearly low-dose CT scan of the lungs is recommended  for people who have at least a 30-pack-year history of smoking and are a current smoker or have quit within the past 15 years. A pack year of smoking is smoking an average of 1 pack of cigarettes a day for 1 year (for example: 1 pack a day for 30 years or 2 packs a day for 15 years). Yearly screening should continue until the smoker has stopped smoking for at least 15 years. Yearly screening should be stopped for people who develop a health problem that would prevent them from having lung cancer treatment.  High blood pressure causes heart disease and increases the risk of stroke. Your blood pressure should be checked at least every 1 to 2 years. Ongoing high blood pressure should be treated with medicines if weight loss and exercise do not work.  If you are 30-80 years old, ask your health care provider if you should take aspirin to prevent strokes.  Diabetes screening involves taking a blood sample to check your fasting blood sugar level. This should be done once every 3 years, after age 61, if you are within normal weight and without risk  factors for diabetes. Testing should be considered at a younger age or be carried out more frequently if you are overweight and have at least 1 risk factor for diabetes.  Breast cancer screening is essential preventive care for women. You should practice "breast self-awareness." This means understanding the normal appearance and feel of your breasts and may include breast self-examination. Any changes detected, no matter how small, should be reported to a health care provider. Women in their 77s and 30s should have a clinical breast exam (CBE) by a health care provider as part of a regular health exam every 1 to 3 years. After age 81, women should have a CBE every year. Starting at age 20, women should consider having a mammogram (breast X-ray test) every year. Women who have a family history of breast cancer should talk to their health care provider about genetic screening. Women at a high risk of breast cancer should talk to their health care providers about having an MRI and a mammogram every year.  Breast cancer gene (BRCA)-related cancer risk assessment is recommended for women who have family members with BRCA-related cancers. BRCA-related cancers include breast, ovarian, tubal, and peritoneal cancers. Having family members with these cancers may be associated with an increased risk for harmful changes (mutations) in the breast cancer genes BRCA1 and BRCA2. Results of the assessment will determine the need for genetic counseling and BRCA1 and BRCA2 testing.  Routine pelvic exams to screen for cancer are no longer recommended for nonpregnant women who are considered low risk for cancer of the pelvic organs (ovaries, uterus, and vagina) and who do not have symptoms. Ask your health care provider if a screening pelvic exam is right for you.  If you have had past treatment for cervical cancer or a condition that could lead to cancer, you need Pap tests and screening for cancer for at least 20 years after  your treatment. If Pap tests have been discontinued, your risk factors (such as having a new sexual partner) need to be reassessed to determine if screening should be resumed. Some women have medical problems that increase the chance of getting cervical cancer. In these cases, your health care provider may recommend more frequent screening and Pap tests.  Colorectal cancer can be detected and often prevented. Most routine colorectal cancer screening begins at the age of 22 years and  continues through age 74 years. However, your health care provider may recommend screening at an earlier age if you have risk factors for colon cancer. On a yearly basis, your health care provider may provide home test kits to check for hidden blood in the stool. Use of a small camera at the end of a tube, to directly examine the colon (sigmoidoscopy or colonoscopy), can detect the earliest forms of colorectal cancer. Talk to your health care provider about this at age 22, when routine screening begins. Direct exam of the colon should be repeated every 5-10 years through age 76 years, unless early forms of pre-cancerous polyps or small growths are found.  Hepatitis C blood testing is recommended for all people born from [birth year] through 1967 and any individual with known risks for hepatitis C.  Pra  Osteoporosis is a disease in which the bones lose minerals and strength with aging. This can result in serious bone fractures or breaks. The risk of osteoporosis can be identified using a bone density scan. Women ages 88 years and over and women at risk for fractures or osteoporosis should discuss screening with their health care providers. Ask your health care provider whether you should take a calcium supplement or vitamin D to reduce the rate of osteoporosis.  Menopause can be associated with physical symptoms and risks. Hormone replacement therapy is available to decrease symptoms and risks. You should talk to your health care  provider about whether hormone replacement therapy is right for you.  Use sunscreen. Apply sunscreen liberally and repeatedly throughout the day. You should seek shade when your shadow is shorter than you. Protect yourself by wearing long sleeves, pants, a wide-brimmed hat, and sunglasses year round, whenever you are outdoors.  Once a month, do a whole body skin exam, using a mirror to look at the skin on your back. Tell your health care provider of new moles, moles that have irregular borders, moles that are larger than a pencil eraser, or moles that have changed in shape or color.  Stay current with required vaccines (immunizations).  Influenza vaccine. All adults should be immunized every year.  Tetanus, diphtheria, and acellular pertussis (Td, Tdap) vaccine. Pregnant women should receive 1 dose of Tdap vaccine during each pregnancy. The dose should be obtained regardless of the length of time since the last dose. Immunization is preferred during the 27th-36th week of gestation. An adult who has not previously received Tdap or who does not know her vaccine status should receive 1 dose of Tdap. This initial dose should be followed by tetanus and diphtheria toxoids (Td) booster doses every 10 years. Adults with an unknown or incomplete history of completing a 3-dose immunization series with Td-containing vaccines should begin or complete a primary immunization series including a Tdap dose. Adults should receive a Td booster every 10 years.  Varicella vaccine. An adult without evidence of immunity to varicella should receive 2 doses or a second dose if she has previously received 1 dose. Pregnant females who do not have evidence of immunity should receive the first dose after pregnancy. This first dose should be obtained before leaving the health care facility. The second dose should be obtained 4-8 weeks after the first dose.  Human papillomavirus (HPV) vaccine. Females aged 13-26 years who have not  received the vaccine previously should obtain the 3-dose series. The vaccine is not recommended for use in pregnant females. However, pregnancy testing is not needed before receiving a dose. If a female is found to  be pregnant after receiving a dose, no treatment is needed. In that case, the remaining doses should be delayed until after the pregnancy. Immunization is recommended for any person with an immunocompromised condition through the age of 14 years if she did not get any or all doses earlier. During the 3-dose series, the second dose should be obtained 4-8 weeks after the first dose. The third dose should be obtained 24 weeks after the first dose and 16 weeks after the second dose.  Zoster vaccine. One dose is recommended for adults aged 87 years or older unless certain conditions are present.  Measles, mumps, and rubella (MMR) vaccine. Adults born before [birth year] generally are considered immune to measles and mumps. Adults born in [birth year] or later should have 1 or more doses of MMR vaccine unless there is a contraindication to the vaccine or there is laboratory evidence of immunity to each of the three diseases. A routine second dose of MMR vaccine should be obtained at least 28 days after the first dose for students attending postsecondary schools, health care workers, or international travelers. People who received inactivated measles vaccine or an unknown type of measles vaccine during 1963-1967 should receive 2 doses of MMR vaccine. People who received inactivated mumps vaccine or an unknown type of mumps vaccine before 1979 and are at high risk for mumps infection should consider immunization with 2 doses of MMR vaccine. For females of childbearing age, rubella immunity should be determined. If there is no evidence of immunity, females who are not pregnant should be vaccinated. If there is no evidence of immunity, females who are pregnant should delay immunization until after pregnancy. Unvaccinated  health care workers born before [birth year] who lack laboratory evidence of measles, mumps, or rubella immunity or laboratory confirmation of disease should consider measles and mumps immunization with 2 doses of MMR vaccine or rubella immunization with 1 dose of MMR vaccine.  Pneumococcal 13-valent conjugate (PCV13) vaccine. When indicated, a person who is uncertain of her immunization history and has no record of immunization should receive the PCV13 vaccine. An adult aged 48 years or older who has certain medical conditions and has not been previously immunized should receive 1 dose of PCV13 vaccine. This PCV13 should be followed with a dose of pneumococcal polysaccharide (PPSV23) vaccine. The PPSV23 vaccine dose should be obtained at least 8 weeks after the dose of PCV13 vaccine. An adult aged 40 years or older who has certain medical conditions and previously received 1 or more doses of PPSV23 vaccine should receive 1 dose of PCV13. The PCV13 vaccine dose should be obtained 1 or more years after the last PPSV23 vaccine dose.    Pneumococcal polysaccharide (PPSV23) vaccine. When PCV13 is also indicated, PCV13 should be obtained first. All adults aged 66 years and older should be immunized. An adult younger than age 28 years who has certain medical conditions should be immunized. Any person who resides in a nursing home or long-term care facility should be immunized. An adult smoker should be immunized. People with an immunocompromised condition and certain other conditions should receive both PCV13 and PPSV23 vaccines. People with human immunodeficiency virus (HIV) infection should be immunized as soon as possible after diagnosis. Immunization during chemotherapy or radiation therapy should be avoided. Routine use of PPSV23 vaccine is not recommended for American Indians, [place] Natives, or people younger than 65 years unless there are medical conditions that require PPSV23 vaccine. When indicated, people who  have unknown immunization and have no record of  immunization should receive PPSV23 vaccine. One-time revaccination 5 years after the first dose of PPSV23 is recommended for people aged 19-64 years who have chronic kidney failure, nephrotic syndrome, asplenia, or immunocompromised conditions. People who received 1-2 doses of PPSV23 before age 15 years should receive another dose of PPSV23 vaccine at age 40 years or later if at least 5 years have passed since the previous dose. Doses of PPSV23 are not needed for people immunized with PPSV23 at or after age 68 years.  Preventive Services / Frequency   Ages 67 to 44 years  Blood pressure check.  Lipid and cholesterol check.  Lung cancer screening. / Every year if you are aged 37-80 years and have a 30-pack-year history of smoking and currently smoke or have quit within the past 15 years. Yearly screening is stopped once you have quit smoking for at least 15 years or develop a health problem that would prevent you from having lung cancer treatment.  Clinical breast exam.** / Every year after age 56 years.  BRCA-related cancer risk assessment.** / For women who have family members with a BRCA-related cancer (breast, ovarian, tubal, or peritoneal cancers).  Mammogram.** / Every year beginning at age 45 years and continuing for as long as you are in good health. Consult with your health care provider.  Pap test.** / Every 3 years starting at age 57 years through age 39 or 44 years with a history of 3 consecutive normal Pap tests.  HPV screening.** / Every 3 years from ages 39 years through ages 21 to 65 years with a history of 3 consecutive normal Pap tests.  Fecal occult blood test (FOBT) of stool. / Every year beginning at age 80 years and continuing until age 60 years. You may not need to do this test if you get a colonoscopy every 10 years.  Flexible sigmoidoscopy or colonoscopy.** / Every 5 years for a flexible sigmoidoscopy or every 10 years  for a colonoscopy beginning at age 44 years and continuing until age 71 years.  Hepatitis C blood test.** / For all people born from [birth year] through 1967 and any individual with known risks for hepatitis C.  Skin self-exam. / Monthly.  Influenza vaccine. / Every year.  Tetanus, diphtheria, and acellular pertussis (Tdap/Td) vaccine.** / Consult your health care provider. Pregnant women should receive 1 dose of Tdap vaccine during each pregnancy. 1 dose of Td every 10 years.  Varicella vaccine.** / Consult your health care provider. Pregnant females who do not have evidence of immunity should receive the first dose after pregnancy.  Zoster vaccine.** / 1 dose for adults aged 30 years or older.  Pneumococcal 13-valent conjugate (PCV13) vaccine.** / Consult your health care provider.  Pneumococcal polysaccharide (PPSV23) vaccine.** / 1 to 2 doses if you smoke cigarettes or if you have certain conditions.  Meningococcal vaccine.** / Consult your health care provider.  Hepatitis A vaccine.** / Consult your health care provider.  Hepatitis B vaccine.** / Consult your health care provider. Screening for abdominal aortic aneurysm (AAA)  by ultrasound is recommended for people over 50 who have history of high blood pressure or who are current or former smokers.

## 2015-12-09 NOTE — Progress Notes (Signed)
Patient ID: Ann Gonzalez, female   DOB: 07/04/1976, 40 y.o.   MRN: [id number]  Annual Screening/Preventative Visit And Comprehensive Evaluation &  Examination     This very nice 40 y.o. MWF presents for a Wellness/Preventative Visit & comprehensive evaluation and management of multiple medical co-morbidities.  Patient has been followed for labile elevated BP, Prediabetes, Hyperlipidemia and Vitamin D Deficiency.      Patient's BP has been controlled at home and patient denies any cardiac symptoms as chest pain, palpitations, shortness of breath, dizziness or ankle swelling. Today's BP: 112/72 mmHg      Patient's hyperlipidemia is controlled with diet and medications. Patient denies myalgias or other medication SE's. Last lipids were not at goal with Total Chol 194, HDL 38, elevated LDL Chol 134 and Trig 111.      Patient has  prediabetes predating since Mar 2016 with A1c 5.8% and patient denies reactive hypoglycemic symptoms, visual blurring, diabetic polys, or paresthesias. Finally, patient has history of Vitamin D Deficiency and last Vitamin D was 14 in Mar 2016.   Medication Sig  . aspirin 81 MG tablet Take 81 mg by mouth daily.  Marland Kitchen CALCIUM + D  Take by mouth. Patient takes calcium 600 mg and Vitamin D 800 iu daily  . VITAMIN D  Take 5,000 Units by mouth daily.  . Multiple Vitamin  Take by mouth.   No Known Allergies   Past Medical History  Diagnosis Date  . HSV (herpes simplex virus) infection   . Newborn product of IVF pregnancy   . GERD (gastroesophageal reflux disease)     during pregnancy  . Headache(784.0)     migraines as teenager  . Dysplasia of cervix    Health Maintenance  Topic Date Due  . INFLUENZA VACCINE  03/28/2016  . TETANUS/TDAP  08/28/2016  . PAP SMEAR  12/23/2017  . HIV Screening  Completed   Immunization History  Administered Date(s) Administered  . Tdap 08/28/2006   Past Surgical History  Procedure Laterality Date  . Wisdom tooth extraction    .  Septoplasty    . Cryoablation    . Embryo transfer    . Cesarean section N/A 09/15/2013    Procedure: CESAREAN SECTION;  Surgeon: Marylynn Pearson, MD;  Location: [facility];  Service: Obstetrics;  Laterality: N/A;  PRIMARY EDC 1/20   Family History  Problem Relation Age of Onset  . Diabetes Maternal Aunt   . Stroke Maternal Grandmother   . Cancer Maternal Grandfather   . Tuberculosis Maternal Grandfather    Social History  Substance Use Topics  . Smoking status: Former Smoker    Quit date: 09/12/2008  . Smokeless tobacco: Never Used  . Alcohol Use: Yes     Comment: social    ROS Constitutional: Denies fever, chills, weight loss/gain, headaches, insomnia,  night sweats, and change in appetite. Does c/o fatigue. Eyes: Denies redness, blurred vision, diplopia, discharge, itchy, watery eyes.  ENT: Denies discharge, congestion, post nasal drip, epistaxis, sore throat, earache, hearing loss, dental pain, Tinnitus, Vertigo, Sinus pain, snoring.  Cardio: Denies chest pain, palpitations, irregular heartbeat, syncope, dyspnea, diaphoresis, orthopnea, PND, claudication, edema Respiratory: denies cough, dyspnea, DOE, pleurisy, hoarseness, laryngitis, wheezing.  Gastrointestinal: Denies dysphagia, heartburn, reflux, water brash, pain, cramps, nausea, vomiting, bloating, diarrhea, constipation, hematemesis, melena, hematochezia, jaundice, hemorrhoids Genitourinary: Denies dysuria, frequency, urgency, nocturia, hesitancy, discharge, hematuria, flank pain Breast: Breast lumps, nipple discharge, bleeding.  Musculoskeletal: Denies arthralgia, myalgia, stiffness, Jt. Swelling, pain, limp, and strain/sprain. Denies falls.  Skin: Denies puritis, rash, hives, warts, acne, eczema, changing in skin lesion Neuro: No weakness, tremor, incoordination, spasms, paresthesia, pain Psychiatric: Denies confusion, memory loss, sensory loss. Denies Depression. Endocrine: Denies change in weight, skin, hair change,  nocturia, and paresthesia, diabetic polys, visual blurring, hyper / hypo glycemic episodes.  Heme/Lymph: No excessive bleeding, bruising, enlarged lymph nodes.  Physical Exam  BP 112/72 mmHg  Pulse 72  Temp(Src) 97.5 F (36.4 C)  Resp 16  Ht 5\' 8"  (1.727 m)  Wt 163 lb 3.2 oz (74.027 kg)  BMI 24.82 kg/m2  General Appearance: Well nourished and in no apparent distress.  Eyes: PERRLA, EOMs, conjunctiva no swelling or erythema, normal fundi and vessels. Sinuses: Slight  frontal/maxillary tenderness ENT/Mouth: EACs patent / TMs  nl. Nares clear without erythema, swelling, mucoid exudates. Oral hygiene is good. No erythema, swelling, or exudate. Tongue normal, non-obstructing. Tonsils not swollen or erythematous. Hearing normal.  Neck: Supple, thyroid normal. No bruits, nodes or JVD. Respiratory: Respiratory effort normal.  BS equal and clear bilateral without rales, rhonci, wheezing or stridor. Cardio: Heart sounds are normal with regular rate and rhythm and no murmurs, rubs or gallops. Peripheral pulses are normal and equal bilaterally without edema. No aortic or femoral bruits. Chest: symmetric with normal excursions and percussion. Breasts: Symmetric, without lumps, nipple discharge, retractions, or fibrocystic changes.  Abdomen: Flat, soft, with bowel sounds. Nontender, no guarding, rebound, hernias, masses, or organomegaly.  Lymphatics: Non tender without lymphadenopathy.  Musculoskeletal: Full ROM all peripheral extremities, joint stability, 5/5 strength, and normal gait. Skin: Warm and dry without rashes, lesions, cyanosis, clubbing or  ecchymosis.  Neuro: Cranial nerves intact, reflexes equal bilaterally. Normal muscle tone, no cerebellar symptoms. Sensation intact.  Pysch: Alert and oriented X 3, normal affect, Insight and Judgment appropriate.   Assessment and Plan  1. Annual Preventative Screening Examination  - Microalbumin / creatinine urine ratio - EKG 12-Lead - POC  Hemoccult Bld/Stl  - Urinalysis, Routine w reflex microscopic  - Vitamin B12 - Iron and TIBC - CBC with Differential/Platelet - BASIC METABOLIC PANEL WITH GFR - Hepatic function panel - Magnesium - Lipid panel - TSH - Hemoglobin A1c - Insulin, random - VITAMIN D 25 Hydroxy  - PPD  2. Elevated BP screening  - Microalbumin / creatinine urine ratio - EKG 12-Lead - TSH  3. Mixed hyperlipidemia  - Lipid panel - TSH  4. Prediabetes  - Hemoglobin A1c - Insulin, random  5. Vitamin D deficiency  - VITAMIN D 25 Hydroxy   6. Screening for rectal cancer  - POC Hemoccult Bld/Stl   7. Other fatigue  - Vitamin B12 - Iron and TIBC - CBC with Differential/Platelet - TSH  8. Medication management  - Urinalysis, Routine w reflex microscopic  - CBC with Differential/Platelet - BASIC METABOLIC PANEL WITH GFR - Hepatic function panel - Magnesium  9. Acute pansinusitis, recurrence not specified  - azithromycin (ZITHROMAX) 250 MG tablet; Take 2 tablets (500 mg) on  Day 1,  followed by 1 tablet (250 mg) once daily on Days 2 through 5.  Dispense: 6 each; Refill: 1 - predniSONE (DELTASONE) 20 MG tablet; 1 tab 3 x day for 3 days, then 1 tab 2 x day for 3 days, then 1 tab 1 x day for 5 days  Dispense: 20 tablet; Refill: 0  10. Screening examination for pulmonary tuberculosis  - PPD   Continue prudent diet as discussed, weight control, BP monitoring, regular exercise, and medications. Discussed med's effects and SE's.  Screening labs and tests as requested with regular follow-up as recommended. Over 40 minutes of exam, counseling, chart review and high complex critical decision making was performed.

## 2015-12-10 ENCOUNTER — Other Ambulatory Visit: Payer: Self-pay | Admitting: Internal Medicine

## 2015-12-10 DIAGNOSIS — N390 Urinary tract infection, site not specified: Secondary | ICD-10-CM

## 2015-12-10 LAB — URINALYSIS, ROUTINE W REFLEX MICROSCOPIC
BILIRUBIN URINE: NEGATIVE
Glucose, UA: NEGATIVE
HGB URINE DIPSTICK: NEGATIVE
KETONES UR: NEGATIVE
Nitrite: POSITIVE — AB
Protein, ur: NEGATIVE
Specific Gravity, Urine: 1.026 (ref 1.001–1.035)
pH: 6 (ref 5.0–8.0)

## 2015-12-10 LAB — MICROALBUMIN / CREATININE URINE RATIO
Creatinine, Urine: 221 mg/dL (ref 20–320)
MICROALB UR: 1 mg/dL
MICROALB/CREAT RATIO: 5 ug/mg{creat} (ref <30)

## 2015-12-10 LAB — URINALYSIS, MICROSCOPIC ONLY
Casts: NONE SEEN [LPF]
Crystals: NONE SEEN [HPF]
RBC / HPF: NONE SEEN RBC/HPF (ref <=2)
SQUAMOUS EPITHELIAL / LPF: NONE SEEN [HPF] (ref <=5)
Yeast: NONE SEEN [HPF]

## 2015-12-10 LAB — VITAMIN D 25 HYDROXY (VIT D DEFICIENCY, FRACTURES): Vit D, 25-Hydroxy: 24 ng/mL — ABNORMAL LOW (ref 30–100)

## 2015-12-10 LAB — INSULIN, RANDOM: Insulin: 7.5 u[IU]/mL (ref 2.0–19.6)

## 2015-12-10 LAB — HEMOGLOBIN A1C
HEMOGLOBIN A1C: 5.4 % (ref <5.7)
Mean Plasma Glucose: 108 mg/dL

## 2015-12-10 MED ORDER — CIPROFLOXACIN HCL 250 MG PO TABS: ORAL_TABLET | ORAL | Status: AC

## 2015-12-13 ENCOUNTER — Encounter: Payer: Self-pay | Admitting: Internal Medicine

## 2015-12-14 LAB — TB SKIN TEST
Induration: 0 mm
TB SKIN TEST: NEGATIVE

## 2016-12-27 ENCOUNTER — Encounter: Payer: Self-pay | Admitting: Internal Medicine

## 2017-03-23 ENCOUNTER — Ambulatory Visit (INDEPENDENT_AMBULATORY_CARE_PROVIDER_SITE_OTHER): Payer: BC Managed Care – PPO | Admitting: Internal Medicine

## 2017-03-23 VITALS — BP 136/82 | HR 92 | Temp 97.3°F | Resp 16 | Ht 67.5 in | Wt 176.2 lb

## 2017-03-23 DIAGNOSIS — Z79899 Other long term (current) drug therapy: Secondary | ICD-10-CM | POA: Diagnosis not present

## 2017-03-23 DIAGNOSIS — E559 Vitamin D deficiency, unspecified: Secondary | ICD-10-CM | POA: Diagnosis not present

## 2017-03-23 DIAGNOSIS — E782 Mixed hyperlipidemia: Secondary | ICD-10-CM

## 2017-03-23 DIAGNOSIS — Z111 Encounter for screening for respiratory tuberculosis: Secondary | ICD-10-CM | POA: Diagnosis not present

## 2017-03-23 DIAGNOSIS — R7303 Prediabetes: Secondary | ICD-10-CM

## 2017-03-23 DIAGNOSIS — Z0000 Encounter for general adult medical examination without abnormal findings: Secondary | ICD-10-CM | POA: Diagnosis not present

## 2017-03-23 DIAGNOSIS — Z136 Encounter for screening for cardiovascular disorders: Secondary | ICD-10-CM

## 2017-03-23 DIAGNOSIS — Z0001 Encounter for general adult medical examination with abnormal findings: Secondary | ICD-10-CM

## 2017-03-23 DIAGNOSIS — Z1212 Encounter for screening for malignant neoplasm of rectum: Secondary | ICD-10-CM

## 2017-03-23 DIAGNOSIS — R030 Elevated blood-pressure reading, without diagnosis of hypertension: Secondary | ICD-10-CM

## 2017-03-23 DIAGNOSIS — R5383 Other fatigue: Secondary | ICD-10-CM

## 2017-03-23 DIAGNOSIS — Z1211 Encounter for screening for malignant neoplasm of colon: Secondary | ICD-10-CM

## 2017-03-23 LAB — CBC WITH DIFFERENTIAL/PLATELET
BASOS PCT: 0 %
Basophils Absolute: 0 cells/uL (ref 0–200)
EOS PCT: 1 %
Eosinophils Absolute: 94 cells/uL (ref 15–500)
HCT: 42.1 % (ref 35.0–45.0)
Hemoglobin: 14.3 g/dL (ref 11.7–15.5)
LYMPHS PCT: 25 %
Lymphs Abs: 2350 cells/uL (ref 850–3900)
MCH: 33.4 pg — ABNORMAL HIGH (ref 27.0–33.0)
MCHC: 34 g/dL (ref 32.0–36.0)
MCV: 98.4 fL (ref 80.0–100.0)
MONOS PCT: 7 %
MPV: 11.2 fL (ref 7.5–12.5)
Monocytes Absolute: 658 cells/uL (ref 200–950)
NEUTROS PCT: 67 %
Neutro Abs: 6298 cells/uL (ref 1500–7800)
PLATELETS: 312 10*3/uL (ref 140–400)
RBC: 4.28 MIL/uL (ref 3.80–5.10)
RDW: 13 % (ref 11.0–15.0)
WBC: 9.4 10*3/uL (ref 3.8–10.8)

## 2017-03-23 LAB — TSH: TSH: 1.53 m[IU]/L

## 2017-03-23 NOTE — Patient Instructions (Signed)

## 2017-03-23 NOTE — Progress Notes (Signed)
[facility] Unk Pinto, M.D.      Uvaldo Bristle. Silverio Lay, P.A.-C [facility]                [address] [place] [address] Telephone [phone] Telefax [phone]  Annual Screening/Preventative Visit & Comprehensive Evaluation &  Examination     This very nice 41 y.o. MWF presents for a Screening/Preventative Visit & comprehensive evaluation and management of multiple medical co-morbidities.  Patient has been followed for hx/o labile elevated BP,  Prediabetes, Hyperlipidemia and Vitamin D Deficiency.      Patient's BP has been controlled at home and patient denies any cardiac symptoms as chest pain, palpitations, shortness of breath, dizziness or ankle swelling. Today's BP is at goal -136/82.      Patient's hyperlipidemia is controlled with diet. Patient denies myalgias or other medication SE's. Last lipids were at goal: Lab Results  Component Value Date   CHOL 181 03/23/2017   HDL 63 03/23/2017   LDLCALC 94 03/23/2017   TRIG 121 03/23/2017   CHOLHDL 2.9 03/23/2017      Patient has prediabetes (A1c 5.8% in Mar 2016)  and patient denies reactive hypoglycemic symptoms, visual blurring, diabetic polys, or paresthesias. Last A1c was at goal: Lab Results  Component Value Date   HGBA1C 5.2 03/23/2017      Finally, patient has history of Vitamin D Deficiency ("38" in Mar 2016) and last Vitamin D was still  Low (goal is 70-100)                                                                                                   : Lab Results  Component Value Date   VD25OH 41 03/23/2017   Current Outpatient Prescriptions on File Prior to Visit  Medication Sig  . aspirin 81 MG tablet Take 81 mg by mouth daily.  . Calcium Carbonate-Vitamin D (CALCIUM + D PO) Take by mouth. Patient takes calcium 600 mg and Vitamin D10000 iu daily  . Multiple Vitamin (MULTI-VITAMINS) TABS Take by mouth.  .  valACYclovir (VALTREX) 500 MG tablet TK 1 T PO D   No current facility-administered medications on file prior to visit.    No Known Allergies Past Medical History:  Diagnosis Date  . Dysplasia of cervix   . GERD (gastroesophageal reflux disease)    during pregnancy  . Headache(784.0)    migraines as teenager  . HSV (herpes simplex virus) infection   . Newborn product of IVF pregnancy    Health Maintenance  Topic Date Due  . TETANUS/TDAP  08/28/2016  . INFLUENZA VACCINE  03/28/2017  . PAP SMEAR  12/23/2017  . HIV Screening  Completed   Immunization History  Administered Date(s) Administered  . PPD Test 12/09/2015  . Tdap 08/28/2006   Past Surgical History:  Procedure Laterality Date  . CESAREAN SECTION N/A 09/15/2013   Procedure: CESAREAN SECTION;  Surgeon: Marylynn Pearson, MD;  Location: [facility];  Service: Obstetrics;  Laterality:  N/A;  PRIMARY EDC 1/20  . CRYOABLATION    . embryo transfer    . SEPTOPLASTY    . WISDOM TOOTH EXTRACTION     Family History  Problem Relation Age of Onset  . Diabetes Maternal Aunt   . Stroke Maternal Grandmother   . Cancer Maternal Grandfather   . Tuberculosis Maternal Grandfather    Social History  Substance Use Topics  . Smoking status: Former Smoker    Quit date: 09/12/2008  . Smokeless tobacco: Never Used  . Alcohol use Yes     Comment: social    ROS Constitutional: Denies fever, chills, weight loss/gain, headaches, insomnia,  night sweats, and change in appetite. Does c/o fatigue. Eyes: Denies redness, blurred vision, diplopia, discharge, itchy, watery eyes.  ENT: Denies discharge, congestion, post nasal drip, epistaxis, sore throat, earache, hearing loss, dental pain, Tinnitus, Vertigo, Sinus pain, snoring.  Cardio: Denies chest pain, palpitations, irregular heartbeat, syncope, dyspnea, diaphoresis, orthopnea, PND, claudication, edema Respiratory: denies cough, dyspnea, DOE, pleurisy, hoarseness, laryngitis, wheezing.   Gastrointestinal: Denies dysphagia, heartburn, reflux, water brash, pain, cramps, nausea, vomiting, bloating, diarrhea, constipation, hematemesis, melena, hematochezia, jaundice, hemorrhoids Genitourinary: Denies dysuria, frequency, urgency, nocturia, hesitancy, discharge, hematuria, flank pain Breast: Breast lumps, nipple discharge, bleeding.  Musculoskeletal: Denies arthralgia, myalgia, stiffness, Jt. Swelling, pain, limp, and strain/sprain. Denies falls. Skin: Denies puritis, rash, hives, warts, acne, eczema, changing in skin lesion Neuro: No weakness, tremor, incoordination, spasms, paresthesia, pain Psychiatric: Denies confusion, memory loss, sensory loss. Denies Depression. Endocrine: Denies change in weight, skin, hair change, nocturia, and paresthesia, diabetic polys, visual blurring, hyper / hypo glycemic episodes.  Heme/Lymph: No excessive bleeding, bruising, enlarged lymph nodes.  Physical Exam  BP 136/82   Pulse 92   Temp (!) 97.3 F (36.3 C)   Resp 16   Ht 5' 7.5" (1.715 m)   Wt 176 lb 3.2 oz (79.9 kg)   BMI 27.19 kg/m   General Appearance: Well nourished, well groomed and in no apparent distress.  Eyes: PERRLA, EOMs, conjunctiva no swelling or erythema, normal fundi and vessels. Sinuses: No frontal/maxillary tenderness ENT/Mouth: EACs patent / TMs  nl. Nares clear without erythema, swelling, mucoid exudates. Oral hygiene is good. No erythema, swelling, or exudate. Tongue normal, non-obstructing. Tonsils not swollen or erythematous. Hearing normal.  Neck: Supple, thyroid normal. No bruits, nodes or JVD. Respiratory: Respiratory effort normal.  BS equal and clear bilateral without rales, rhonci, wheezing or stridor. Cardio: Heart sounds are normal with regular rate and rhythm and no murmurs, rubs or gallops. Peripheral pulses are normal and equal bilaterally without edema. No aortic or femoral bruits. Chest: symmetric with normal excursions and percussion. Breasts:  Symmetric, without lumps, nipple discharge, retractions, or fibrocystic changes.  Abdomen: Flat, soft with bowel sounds active. Nontender, no guarding, rebound, hernias, masses, or organomegaly.  Lymphatics: Non tender without lymphadenopathy.  Genitourinary:  Musculoskeletal: Full ROM all peripheral extremities, joint stability, 5/5 strength, and normal gait. Skin: Warm and dry without rashes, lesions, cyanosis, clubbing or  ecchymosis.  Neuro: Cranial nerves intact, reflexes equal bilaterally. Normal muscle tone, no cerebellar symptoms. Sensation intact.  Pysch: Alert and oriented X 3, normal affect, Insight and Judgment appropriate.   Assessment and Plan  1. Annual Preventative Screening Examination       Patient was counseled in prudent diet to achieve/maintain BMI less than 25 for weight control, BP monitoring, regular exercise and medications. Discussed med's effects and SE's. Screening labs and tests as requested with regular follow-up as recommended. Over  40 minutes of exam, counseling, chart review and high complex critical decision making was performed.

## 2017-03-24 ENCOUNTER — Encounter: Payer: Self-pay | Admitting: Internal Medicine

## 2017-03-24 LAB — BASIC METABOLIC PANEL WITH GFR
BUN: 8 mg/dL (ref 7–25)
CALCIUM: 9.4 mg/dL (ref 8.6–10.2)
CO2: 20 mmol/L (ref 20–31)
Chloride: 103 mmol/L (ref 98–110)
Creat: 0.74 mg/dL (ref 0.50–1.10)
GFR, Est African American: >89 mL/min (ref >=60)
GLUCOSE: 95 mg/dL (ref 65–99)
POTASSIUM: 4 mmol/L (ref 3.5–5.3)
Sodium: 139 mmol/L (ref 135–146)

## 2017-03-24 LAB — URINALYSIS, ROUTINE W REFLEX MICROSCOPIC
Bilirubin Urine: NEGATIVE
Glucose, UA: NEGATIVE
Hgb urine dipstick: NEGATIVE
Ketones, ur: NEGATIVE
Leukocytes, UA: NEGATIVE
NITRITE: NEGATIVE
PH: 7.5 (ref 5.0–8.0)
Protein, ur: NEGATIVE
SPECIFIC GRAVITY, URINE: 1.011 (ref 1.001–1.035)

## 2017-03-24 LAB — HEPATIC FUNCTION PANEL
ALT: 11 U/L (ref 6–29)
AST: 14 U/L (ref 10–30)
Albumin: 4.4 g/dL (ref 3.6–5.1)
Alkaline Phosphatase: 70 U/L (ref 33–115)
BILIRUBIN DIRECT: 0.1 mg/dL (ref <=0.2)
BILIRUBIN INDIRECT: 0.3 mg/dL (ref 0.2–1.2)
TOTAL PROTEIN: 7.1 g/dL (ref 6.1–8.1)
Total Bilirubin: 0.4 mg/dL (ref 0.2–1.2)

## 2017-03-24 LAB — LIPID PANEL
Cholesterol: 181 mg/dL (ref <200)
HDL: 63 mg/dL (ref >50)
LDL CALC: 94 mg/dL (ref <100)
Total CHOL/HDL Ratio: 2.9 Ratio (ref <5.0)
Triglycerides: 121 mg/dL (ref <150)
VLDL: 24 mg/dL (ref <30)

## 2017-03-24 LAB — HEMOGLOBIN A1C
HEMOGLOBIN A1C: 5.2 % (ref <5.7)
MEAN PLASMA GLUCOSE: 103 mg/dL

## 2017-03-24 LAB — MICROALBUMIN / CREATININE URINE RATIO: CREATININE, URINE: 60 mg/dL (ref 20–320)

## 2017-03-24 LAB — IRON AND TIBC
%SAT: 20 % (ref 11–50)
Iron: 71 ug/dL (ref 40–190)
TIBC: 363 ug/dL (ref 250–450)
UIBC: 292 ug/dL

## 2017-03-24 LAB — VITAMIN B12: VITAMIN B 12: 372 pg/mL (ref 200–1100)

## 2017-03-24 LAB — VITAMIN D 25 HYDROXY (VIT D DEFICIENCY, FRACTURES): Vit D, 25-Hydroxy: 41 ng/mL (ref 30–100)

## 2017-03-24 LAB — MAGNESIUM: Magnesium: 1.8 mg/dL (ref 1.5–2.5)

## 2017-03-26 DIAGNOSIS — Z111 Encounter for screening for respiratory tuberculosis: Secondary | ICD-10-CM | POA: Diagnosis not present

## 2017-03-26 LAB — INSULIN, RANDOM: Insulin: 8.4 u[IU]/mL (ref 2.0–19.6)

## 2017-03-26 NOTE — Addendum Note (Signed)
Addended by: Melbourne Abts C on: 03/26/2017 08:30 AM   Modules accepted: Orders

## 2018-01-16 ENCOUNTER — Encounter: Payer: Self-pay | Admitting: Internal Medicine

## 2018-04-30 ENCOUNTER — Encounter: Payer: Self-pay | Admitting: Internal Medicine

## 2019-05-19 ENCOUNTER — Encounter: Payer: Self-pay | Admitting: Internal Medicine

## 2021-01-10 ENCOUNTER — Encounter: Payer: Self-pay | Admitting: Adult Health

## 2021-01-10 DIAGNOSIS — E663 Overweight: Secondary | ICD-10-CM | POA: Insufficient documentation

## 2021-01-10 DIAGNOSIS — E538 Deficiency of other specified B group vitamins: Secondary | ICD-10-CM | POA: Insufficient documentation

## 2021-01-10 NOTE — Progress Notes (Signed)
Complete Physical  Assessment and Plan:  Ann Gonzalez was seen today for annual exam.  Diagnoses and all orders for this visit:  Encounter for routine adult health examination without abnormal findings Due annually  Forward GYN reports  Elevated blood-pressure reading without diagnosis of hypertension Monitor blood pressure at home; call if consistently over 130/80 Continue DASH diet.   Reminder to go to the ER if any CP, SOB, nausea, dizziness, severe HA, changes vision/speech, left arm numbness and tingling and jaw pain. -     CBC with Differential/Platelet -     COMPLETE METABOLIC PANEL WITH GFR -     Magnesium -     TSH -     Microalbumin / creatinine urine ratio -     Urinalysis, Routine w reflex microscopic -     EKG 12-Lead  Gastroesophageal reflux disease, unspecified whether esophagitis present Well managed by lifestyle Discussed diet, avoiding triggers and other lifestyle changes  Mixed hyperlipidemia Currently well controlled by lifestyle Continue low cholesterol diet and exercise.  Check lipid panel.  -     Lipid panel -     TSH  Other abnormal glucose/ Family history of diabvetes Discussed disease and risks Discussed diet/exercise, weight management  -     COMPLETE METABOLIC PANEL WITH GFR -     Hemoglobin A1c  Overweight (BMI 25.0-29.9) Long discussion about weight loss, diet, and exercise Recommended diet heavy in fruits and veggies and low in animal meats, cheeses, and dairy products, appropriate calorie intake Patient will work on increasing exercise, high fiber minimally processed plant based diet Discussed appropriate weight for height, initial goal < 160 lb Follow up at next visit  Vitamin D deficiency -     VITAMIN D 25 Hydroxy (Vit-D Deficiency, Fractures)  B12 deficiency -     Vitamin B12  Medication management -     CBC with Differential/Platelet -     COMPLETE METABOLIC PANEL WITH GFR -     Magnesium  Screening for thyroid disorder -      TSH  Screening for hematuria or proteinuria -     Microalbumin / creatinine urine ratio -     Urinalysis, Routine w reflex microscopic  Screening for cardiovascular condition -     EKG 12-Lead  Disorder of choroid Benign; specialist monitors annually   Former smoker/ night sweats  Hot flashes vs r/o lung etiology -  -     DG Chest 2 View; Future  Screening for colon cancer -     Ambulatory referral to Gastroenterology  Insomnia, unspecified type -     gabapentin (NEURONTIN) 100 MG capsule; Take 1-3 caps 1 hour prior to bedtime for sleep and hot flashes.   Discussed med's effects and SE's. Screening labs and tests as requested with regular follow-up as recommended. Over 40 minutes of exam, counseling, chart review, and complex, high level critical decision making was performed this visit.   Future Appointments  Date Time Provider [place]  01/12/2022 10:00 AM Liane Comber, NP [facility] None     HPI  45 y.o. female  presents for a complete physical and follow up for has GERD (gastroesophageal reflux disease); Vitamin D deficiency; Medication management; Mixed hyperlipidemia; Elevated blood-pressure reading without diagnosis of hypertension; Overweight (BMI 25.0-29.9); B12 deficiency; Disorder of choroid; and Insomnia on their problem list.   Lost to follow up, last seen 02/2017.   She is married, 1 child, works remotely as region Therapist, occupational. She is working remotely  and enjoys her job.   Follows with GYN, had C section at age 73. Starting to have some night time sweats. Still having cycles, not on birth control (hx of infertility, but would be receptive to child if becomes pregnant). Notably is remote smoker. Est 7.5 pack year, quite 2010. Denies fatigue/unintended weight loss.   GERD - recently well controlled by of lifestyle.   BMI is Body mass index is 27.95 kg/m., she has been working on diet and exercise. Wt Readings from Last 3  Encounters:  01/12/21 179 lb 12.8 oz (81.6 kg)  03/23/17 176 lb 3.2 oz (79.9 kg)  12/09/15 163 lb 3.2 oz (74 kg)   Today their BP is BP: 126/80 She does workout. She denies chest pain, shortness of breath, dizziness.   She is not on cholesterol medication, hx of elevations improved with lifestyle modification. Her cholesterol is at goal. The cholesterol last visit was:   Lab Results  Component Value Date   CHOL 181 03/23/2017   HDL 63 03/23/2017   LDLCALC 94 03/23/2017   TRIG 121 03/23/2017   CHOLHDL 2.9 03/23/2017   She has been working on diet and exercise for hx of prediabetes (A1C 5.8% in 2016), denies increased appetite, nausea, paresthesia of the feet, polydipsia and polyuria. Last A1C in the office was:  Lab Results  Component Value Date   HGBA1C 5.2 03/23/2017   Last GFR: Lab Results  Component Value Date   GFRNONAA >89 03/23/2017   Patient is not currently on Vitamin D supplement Lab Results  Component Value Date   VD25OH 41 03/23/2017        Current Medications:  Current Outpatient Medications on File Prior to Visit  Medication Sig Dispense Refill  . valACYclovir (VALTREX) 500 MG tablet TK 1 T PO D  6  . aspirin 81 MG tablet Take 81 mg by mouth daily. (Patient not taking: Reported on 01/12/2021)    . Calcium Carbonate-Vitamin D (CALCIUM + D PO) Take by mouth. Patient takes calcium 600 mg and Vitamin D10000 iu daily (Patient not taking: Reported on 01/12/2021)    . Multiple Vitamin (MULTI-VITAMINS) TABS Take by mouth. (Patient not taking: Reported on 01/12/2021)     No current facility-administered medications on file prior to visit.   Allergies:  Allergies  Allergen Reactions  . Wound Dressing Adhesive    Medical History:  She has GERD (gastroesophageal reflux disease); Vitamin D deficiency; Medication management; Mixed hyperlipidemia; Elevated blood-pressure reading without diagnosis of hypertension; Overweight (BMI 25.0-29.9); B12 deficiency; Disorder of  choroid; and Insomnia on their problem list.  Health Maintenance:   Immunization History  Administered Date(s) Administered  . PFIZER(Purple Top)SARS-COV-2 Vaccination 10/24/2019, 11/18/2019, 07/01/2020  . PPD Test 12/09/2015, 03/26/2017  . Tdap 08/28/2006, 01/26/2013    Tetanus: 2014 when pregnant Pneumovax: Flu vaccine: gets annually, had in 2021 Shingrix: age 24 Covid 39: 2/2, pfizer has had booster  LMP: No LMP recorded. (Menstrual status: Perimenopausal). Pap: goes anually to GYN, has scheduled in July  MGM: gets annually at GYN office  DEXA:  Colonoscopy: due, refer for GI EGD:  Last Dental Exam: goes q18m, last 2022, Dr. 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 Baird Cancer retinal specialist follows annually, also with regular eye doctor annually (Dr. Gilford Rile) annually  Patient Care Team: Unk Pinto, MD as PCP - General (Internal Medicine)  Surgical History:  She has a past surgical history that includes Wisdom tooth extraction; Septoplasty; Cryoablation; embryo transfer; and Cesarean section (  N/A, 09/15/2013). Family History:  Herfamily history includes Cancer in her maternal grandfather; Diabetes in her maternal aunt; Diabetes type II in her mother and sister; Hypertension in her mother; Polymyositis in her father; Rheum arthritis in her mother; Stroke in her maternal grandmother; Tuberculosis in her maternal grandfather. Social History:  She reports that she quit smoking about 12 years ago. Her smoking use included cigarettes. She has a 7.50 pack-year smoking history. She has never used smokeless tobacco. She reports current alcohol use. She reports that she does not use drugs.  Review of Systems: Review of Systems  Constitutional: Positive for diaphoresis (intermittently at night). Negative for malaise/fatigue and weight loss.  HENT: Negative for hearing loss and tinnitus.   Eyes: Negative for blurred vision and double vision.   Respiratory: Negative for cough, shortness of breath and wheezing.   Cardiovascular: Negative for chest pain, palpitations, orthopnea, claudication and leg swelling.  Gastrointestinal: Negative for abdominal pain, blood in stool, constipation, diarrhea, heartburn, melena, nausea and vomiting.  Genitourinary: Negative.   Musculoskeletal: Negative for joint pain and myalgias.  Skin: Negative for rash.  Neurological: Negative for dizziness, tingling, sensory change, weakness and headaches.  Endo/Heme/Allergies: Negative for polydipsia.  Psychiatric/Behavioral: Negative for depression, memory loss, substance abuse and suicidal ideas. The patient has insomnia (difficulty staying asleep, irregular). The patient is not nervous/anxious.   All other systems reviewed and are negative.   Physical Exam: Estimated body mass index is 27.95 kg/m as calculated from the following:   Height as of this encounter: 5' 7.25" (1.708 m).   Weight as of this encounter: 179 lb 12.8 oz (81.6 kg). BP 126/80   Pulse 77   Temp (!) 97.5 F (36.4 C)   Ht 5' 7.25" (1.708 m)   Wt 179 lb 12.8 oz (81.6 kg)   SpO2 98%   BMI 27.95 kg/m  General Appearance: Well nourished, in no apparent distress.  Eyes: PERRLA, EOMs, conjunctiva no swelling or erythema Sinuses: No Frontal/maxillary tenderness  ENT/Mouth: Ext aud canals clear, normal light reflex with TMs without erythema, bulging. Good dentition. No erythema, swelling, or exudate on post pharynx. Tonsils not swollen or erythematous. Hearing normal.  Neck: Supple, thyroid normal. No bruits  Respiratory: Respiratory effort normal, BS equal bilaterally without rales, rhonchi, wheezing or stridor.  Cardio: RRR without murmurs, rubs or gallops. Brisk peripheral pulses without edema.  Chest: symmetric, with normal excursions and percussion.  Breasts: Defer to GYN Abdomen: Soft, nontender, no guarding, rebound, hernias, masses, or organomegaly.  Lymphatics: Non tender  without lymphadenopathy.  Genitourinary: defer to GYN Musculoskeletal: Full ROM all peripheral extremities,5/5 strength, and normal gait.  Skin: Warm, dry without rashes, lesions, ecchymosis. Neuro: Cranial nerves intact, reflexes equal bilaterally. Normal muscle tone, no cerebellar symptoms. Sensation intact.  Psych: Awake and oriented X 3, normal affect, Insight and Judgment appropriate.   EKG: Sinus bradycardia, NSCPT  Gorden Harms Sehaj Mcenroe 12:51 PM [facility]

## 2021-01-12 ENCOUNTER — Ambulatory Visit (INDEPENDENT_AMBULATORY_CARE_PROVIDER_SITE_OTHER): Payer: BC Managed Care – PPO | Admitting: Adult Health

## 2021-01-12 ENCOUNTER — Other Ambulatory Visit: Payer: Self-pay

## 2021-01-12 ENCOUNTER — Encounter: Payer: Self-pay | Admitting: Adult Health

## 2021-01-12 ENCOUNTER — Ambulatory Visit
Admission: RE | Admit: 2021-01-12 | Discharge: 2021-01-12 | Disposition: A | Discharge status: Home / Self Care | Payer: BC Managed Care – PPO | Source: Ambulatory Visit | Attending: Adult Health | Admitting: Adult Health

## 2021-01-12 VITALS — BP 126/80 | HR 77 | Temp 97.5°F | Ht 67.25 in | Wt 179.8 lb

## 2021-01-12 DIAGNOSIS — H319 Unspecified disorder of choroid: Secondary | ICD-10-CM | POA: Insufficient documentation

## 2021-01-12 DIAGNOSIS — D164 Benign neoplasm of bones of skull and face: Secondary | ICD-10-CM | POA: Insufficient documentation

## 2021-01-12 DIAGNOSIS — R030 Elevated blood-pressure reading, without diagnosis of hypertension: Secondary | ICD-10-CM | POA: Diagnosis not present

## 2021-01-12 DIAGNOSIS — Z79899 Other long term (current) drug therapy: Secondary | ICD-10-CM | POA: Diagnosis not present

## 2021-01-12 DIAGNOSIS — Z136 Encounter for screening for cardiovascular disorders: Secondary | ICD-10-CM | POA: Diagnosis not present

## 2021-01-12 DIAGNOSIS — Z1389 Encounter for screening for other disorder: Secondary | ICD-10-CM

## 2021-01-12 DIAGNOSIS — Z8249 Family history of ischemic heart disease and other diseases of the circulatory system: Secondary | ICD-10-CM

## 2021-01-12 DIAGNOSIS — Z131 Encounter for screening for diabetes mellitus: Secondary | ICD-10-CM

## 2021-01-12 DIAGNOSIS — Z0000 Encounter for general adult medical examination without abnormal findings: Secondary | ICD-10-CM

## 2021-01-12 DIAGNOSIS — Z130 Encounter for screening for diseases of the blood and blood-forming organs and certain disorders involving the immune mechanism: Secondary | ICD-10-CM | POA: Diagnosis not present

## 2021-01-12 DIAGNOSIS — Z87891 Personal history of nicotine dependence: Secondary | ICD-10-CM

## 2021-01-12 DIAGNOSIS — G4700 Insomnia, unspecified: Secondary | ICD-10-CM

## 2021-01-12 DIAGNOSIS — Z1211 Encounter for screening for malignant neoplasm of colon: Secondary | ICD-10-CM

## 2021-01-12 DIAGNOSIS — Z1329 Encounter for screening for other suspected endocrine disorder: Secondary | ICD-10-CM

## 2021-01-12 DIAGNOSIS — Z13220 Encounter for screening for lipoid disorders: Secondary | ICD-10-CM | POA: Diagnosis not present

## 2021-01-12 DIAGNOSIS — E538 Deficiency of other specified B group vitamins: Secondary | ICD-10-CM

## 2021-01-12 DIAGNOSIS — E559 Vitamin D deficiency, unspecified: Secondary | ICD-10-CM | POA: Diagnosis not present

## 2021-01-12 DIAGNOSIS — K219 Gastro-esophageal reflux disease without esophagitis: Secondary | ICD-10-CM

## 2021-01-12 DIAGNOSIS — E663 Overweight: Secondary | ICD-10-CM

## 2021-01-12 DIAGNOSIS — E782 Mixed hyperlipidemia: Secondary | ICD-10-CM

## 2021-01-12 DIAGNOSIS — R7309 Other abnormal glucose: Secondary | ICD-10-CM

## 2021-01-12 LAB — CBC WITH DIFFERENTIAL/PLATELET
Basophils Absolute: 22 cells/uL (ref 0–200)
Eosinophils Relative: 1 %
Hemoglobin: 12.9 g/dL (ref 11.7–15.5)
Lymphs Abs: 2015 cells/uL (ref 850–3900)
MPV: 12.4 fL (ref 7.5–12.5)
Platelets: 280 10*3/uL (ref 140–400)
Total Lymphocyte: 27.6 %

## 2021-01-12 MED ORDER — GABAPENTIN 100 MG PO CAPS: ORAL_CAPSULE | ORAL | 0 refills | Status: DC

## 2021-01-12 NOTE — Patient Instructions (Addendum)
  Ms. Skalsky , Thank you for taking time to come for your Annual Wellness Visit. I appreciate your ongoing commitment to your health goals. Please review the following plan we discussed and let me know if I can assist you in the future.   These are the goals we discussed: Goals    . Exercise 150 min/wk Moderate Activity    . Weight (lb) < 160 lb (72.6 kg)       This is a list of the screening recommended for you and due dates:  Health Maintenance  Topic Date Due  . Pap Smear  12/23/2017  . Colon Cancer Screening  Never done  . Flu Shot  03/28/2021  . Tetanus Vaccine  01/27/2023  . COVID-19 Vaccine  Completed  . HIV Screening  Completed  . HPV Vaccine  Aged Out  . Hepatitis C Screening: USPSTF Recommendation to screen - Ages 24-79 yo.  Discontinued     Please get chest xray at [address] [facility] - no appointment needed, can just walk in and give your name  Please have GYN forward PAP and mammogram reports    Know what a healthy weight is for you (roughly BMI <25) and aim to maintain this  Aim for 7+ servings of fruits and vegetables daily  65-80+ fluid ounces of water or unsweet tea for healthy kidneys  Limit to max 1 drink of alcohol per day; avoid smoking/tobacco  Limit animal fats in diet for cholesterol and heart health - choose grass fed whenever available  Avoid highly processed foods, and foods high in saturated/trans fats  Aim for low stress - take time to unwind and care for your mental health  Aim for 150 min of moderate intensity exercise weekly for heart health, and weights twice weekly for bone health  Aim for 7-9 hours of sleep daily

## 2021-01-13 LAB — URINALYSIS, ROUTINE W REFLEX MICROSCOPIC
Bilirubin Urine: NEGATIVE
Glucose, UA: NEGATIVE
Hgb urine dipstick: NEGATIVE
Ketones, ur: NEGATIVE
Leukocytes,Ua: NEGATIVE
Nitrite: NEGATIVE
Protein, ur: NEGATIVE
Specific Gravity, Urine: 1.019 (ref 1.001–1.035)
pH: 8.5 — ABNORMAL HIGH (ref 5.0–8.0)

## 2021-01-13 LAB — HEMOGLOBIN A1C
Hgb A1c MFr Bld: 5.4 % of total Hgb (ref <5.7)
Mean Plasma Glucose: 108 mg/dL
eAG (mmol/L): 6 mmol/L

## 2021-01-13 LAB — MAGNESIUM: Magnesium: 1.9 mg/dL (ref 1.5–2.5)

## 2021-01-13 LAB — LIPID PANEL
Cholesterol: 206 mg/dL — ABNORMAL HIGH (ref <200)
HDL: 76 mg/dL (ref >=50)
LDL Cholesterol (Calc): 102 mg/dL (calc) — ABNORMAL HIGH
Non-HDL Cholesterol (Calc): 130 mg/dL (calc) — ABNORMAL HIGH (ref <130)
Total CHOL/HDL Ratio: 2.7 (calc) (ref <5.0)
Triglycerides: 161 mg/dL — ABNORMAL HIGH (ref <150)

## 2021-01-13 LAB — CBC WITH DIFFERENTIAL/PLATELET
Absolute Monocytes: 533 cells/uL (ref 200–950)
Basophils Relative: 0.3 %
Eosinophils Absolute: 73 cells/uL (ref 15–500)
HCT: 39.6 % (ref 35.0–45.0)
MCH: 31.9 pg (ref 27.0–33.0)
MCHC: 32.6 g/dL (ref 32.0–36.0)
MCV: 98 fL (ref 80.0–100.0)
Monocytes Relative: 7.3 %
Neutro Abs: 4657 cells/uL (ref 1500–7800)
Neutrophils Relative %: 63.8 %
RBC: 4.04 10*6/uL (ref 3.80–5.10)
RDW: 12 % (ref 11.0–15.0)
WBC: 7.3 10*3/uL (ref 3.8–10.8)

## 2021-01-13 LAB — TSH: TSH: 1.63 mIU/L

## 2021-01-13 LAB — COMPLETE METABOLIC PANEL WITH GFR
AG Ratio: 1.5 (calc) (ref 1.0–2.5)
ALT: 16 U/L (ref 6–29)
AST: 17 U/L (ref 10–35)
Albumin: 4.1 g/dL (ref 3.6–5.1)
Alkaline phosphatase (APISO): 53 U/L (ref 31–125)
BUN: 12 mg/dL (ref 7–25)
CO2: 24 mmol/L (ref 20–32)
Calcium: 9.5 mg/dL (ref 8.6–10.2)
Chloride: 105 mmol/L (ref 98–110)
Creat: 0.72 mg/dL (ref 0.50–1.10)
GFR, Est African American: 116 mL/min/{1.73_m2} (ref >=60)
GFR, Est Non African American: 100 mL/min/{1.73_m2} (ref >=60)
Globulin: 2.7 g/dL (calc) (ref 1.9–3.7)
Glucose, Bld: 81 mg/dL (ref 65–99)
Potassium: 4.3 mmol/L (ref 3.5–5.3)
Sodium: 140 mmol/L (ref 135–146)
Total Bilirubin: 0.4 mg/dL (ref 0.2–1.2)
Total Protein: 6.8 g/dL (ref 6.1–8.1)

## 2021-01-13 LAB — VITAMIN B12: Vitamin B-12: 268 pg/mL (ref 200–1100)

## 2021-01-13 LAB — MICROALBUMIN / CREATININE URINE RATIO
Creatinine, Urine: 77 mg/dL (ref 20–275)
Microalb Creat Ratio: 4 mcg/mg creat (ref <30)
Microalb, Ur: 0.3 mg/dL

## 2021-01-13 LAB — VITAMIN D 25 HYDROXY (VIT D DEFICIENCY, FRACTURES): Vit D, 25-Hydroxy: 30 ng/mL (ref 30–100)

## 2021-01-14 ENCOUNTER — Other Ambulatory Visit: Payer: Self-pay | Admitting: Adult Health

## 2021-01-14 DIAGNOSIS — J8410 Pulmonary fibrosis, unspecified: Secondary | ICD-10-CM

## 2021-01-14 DIAGNOSIS — J984 Other disorders of lung: Secondary | ICD-10-CM

## 2021-01-14 DIAGNOSIS — R61 Generalized hyperhidrosis: Secondary | ICD-10-CM

## 2021-01-14 DIAGNOSIS — R232 Flushing: Secondary | ICD-10-CM | POA: Insufficient documentation

## 2021-01-14 HISTORY — DX: Other disorders of lung: J98.4

## 2021-01-14 HISTORY — DX: Pulmonary fibrosis, unspecified: J84.10

## 2021-01-17 ENCOUNTER — Other Ambulatory Visit: Payer: Self-pay | Admitting: Adult Health

## 2021-01-17 DIAGNOSIS — R61 Generalized hyperhidrosis: Secondary | ICD-10-CM

## 2021-01-17 DIAGNOSIS — J8410 Pulmonary fibrosis, unspecified: Secondary | ICD-10-CM

## 2021-01-17 DIAGNOSIS — Z87891 Personal history of nicotine dependence: Secondary | ICD-10-CM

## 2021-01-19 ENCOUNTER — Encounter: Payer: Self-pay | Admitting: Gastroenterology

## 2021-02-03 ENCOUNTER — Other Ambulatory Visit: Payer: Self-pay | Admitting: Adult Health

## 2021-02-24 ENCOUNTER — Other Ambulatory Visit: Payer: BC Managed Care – PPO

## 2021-02-24 ENCOUNTER — Other Ambulatory Visit: Payer: Self-pay

## 2021-02-24 DIAGNOSIS — R61 Generalized hyperhidrosis: Secondary | ICD-10-CM

## 2021-02-24 DIAGNOSIS — J8410 Pulmonary fibrosis, unspecified: Secondary | ICD-10-CM

## 2021-02-25 ENCOUNTER — Other Ambulatory Visit: Payer: BC Managed Care – PPO

## 2021-02-26 LAB — QUANTIFERON-TB GOLD PLUS
Mitogen-NIL: >10 IU/mL
NIL: 0.02 IU/mL
QuantiFERON-TB Gold Plus: NEGATIVE
TB1-NIL: 0 IU/mL
TB2-NIL: 0 IU/mL

## 2021-03-09 LAB — HM MAMMOGRAPHY

## 2021-03-10 LAB — RESULTS CONSOLE HPV: CHL HPV: POSITIVE

## 2021-03-14 ENCOUNTER — Ambulatory Visit (AMBULATORY_SURGERY_CENTER): Payer: BC Managed Care – PPO | Admitting: *Deleted

## 2021-03-14 ENCOUNTER — Other Ambulatory Visit: Payer: Self-pay

## 2021-03-14 VITALS — Ht 67.25 in | Wt 179.0 lb

## 2021-03-14 DIAGNOSIS — Z1211 Encounter for screening for malignant neoplasm of colon: Secondary | ICD-10-CM

## 2021-03-14 MED ORDER — PLENVU 140 G PO SOLR: 1.0000 | Freq: Once | ORAL | 0 refills | Status: AC

## 2021-03-14 NOTE — Progress Notes (Signed)
Patient's pre-visit was done today over the phone with the patient due to COVID-19 pandemic. Name,DOB and address verified. Insurance verified. Patient denies any allergies to Eggs and Soy. Patient denies any problems with anesthesia/sedation. Patient denies taking diet pills or blood thinners. No home Oxygen. Packet of Prep instructions mailed to patient including a copy of a consent form & Coupon-pt is aware. Patient understands to call us back with any questions or concerns. Patient is aware of our care-partner policy and [id number] safety protocol.   EMMI education assigned to the patient for the procedure, sent to [place].   The patient is COVID-19 vaccinated, per patient.

## 2021-03-28 ENCOUNTER — Encounter: Payer: Self-pay | Admitting: Gastroenterology

## 2021-03-28 ENCOUNTER — Other Ambulatory Visit: Payer: Self-pay

## 2021-03-28 ENCOUNTER — Ambulatory Visit (AMBULATORY_SURGERY_CENTER): Payer: BC Managed Care – PPO | Admitting: Gastroenterology

## 2021-03-28 VITALS — BP 98/64 | HR 78 | Temp 98.4°F | Resp 19 | Ht 67.25 in | Wt 179.0 lb

## 2021-03-28 DIAGNOSIS — Z1211 Encounter for screening for malignant neoplasm of colon: Secondary | ICD-10-CM | POA: Diagnosis present

## 2021-03-28 DIAGNOSIS — K621 Rectal polyp: Secondary | ICD-10-CM | POA: Diagnosis not present

## 2021-03-28 DIAGNOSIS — D128 Benign neoplasm of rectum: Secondary | ICD-10-CM

## 2021-03-28 DIAGNOSIS — D122 Benign neoplasm of ascending colon: Secondary | ICD-10-CM

## 2021-03-28 DIAGNOSIS — K635 Polyp of colon: Secondary | ICD-10-CM

## 2021-03-28 MED ORDER — SODIUM CHLORIDE 0.9 % IV SOLN: 500.0000 mL | INTRAVENOUS | Status: DC

## 2021-03-28 NOTE — Progress Notes (Signed)
Pt's states no medical or surgical changes since previsit or office visit. 

## 2021-03-28 NOTE — Patient Instructions (Signed)
YOU HAD AN ENDOSCOPIC PROCEDURE TODAY AT [facility]:   Refer to the procedure report that was given to you for any specific questions about what was found during the examination.  If the procedure report does not answer your questions, please call your gastroenterologist to clarify.  If you requested that your care partner not be given the details of your procedure findings, then the procedure report has been included in a sealed envelope for you to review at your convenience later.  YOU SHOULD EXPECT: Some feelings of bloating in the abdomen. Passage of more gas than usual.  Walking can help get rid of the air that was put into your GI tract during the procedure and reduce the bloating. If you had a lower endoscopy (such as a colonoscopy or flexible sigmoidoscopy) you may notice spotting of blood in your stool or on the toilet paper. If you underwent a bowel prep for your procedure, you may not have a normal bowel movement for a few days.  Please Note:  You might notice some irritation and congestion in your nose or some drainage.  This is from the oxygen used during your procedure.  There is no need for concern and it should clear up in a day or so.  SYMPTOMS TO REPORT IMMEDIATELY:  Following lower endoscopy (colonoscopy or flexible sigmoidoscopy):  Excessive amounts of blood in the stool  Significant tenderness or worsening of abdominal pains  Swelling of the abdomen that is new, acute  Fever of 100F or higher   For urgent or emergent issues, a gastroenterologist can be reached at any hour by calling [phone]. Do not use MyChart messaging for urgent concerns.    DIET:  We do recommend a small meal at first, but then you may proceed to your regular diet.  Drink plenty of fluids but you should avoid alcoholic beverages for 24 hours. Try to eat lots of fiber and grains. Drink plenty of water, and cut back on red meat. ACTIVITY:  You should plan to take it easy for the  rest of today and you should NOT DRIVE or use heavy machinery until tomorrow (because of the sedation medicines used during the test).    FOLLOW UP: Our staff will call the number listed on your records 48-72 hours following your procedure to check on you and address any questions or concerns that you may have regarding the information given to you following your procedure. If we do not reach you, we will leave a message.  We will attempt to reach you two times.  During this call, we will ask if you have developed any symptoms of COVID 19. If you develop any symptoms (ie: fever, flu-like symptoms, shortness of breath, cough etc.) before then, please call [phone].  If you test positive for Covid 19 in the 2 weeks post procedure, please call and report this information to [place].    If any biopsies were taken you will be contacted by phone or by letter within the next 1-3 weeks.  Please call us at [phone] if you have not heard about the biopsies in 3 weeks.    SIGNATURES/CONFIDENTIALITY: You and/or your care partner have signed paperwork which will be entered into your electronic medical record.  These signatures attest to the fact that that the information above on your After Visit Summary has been reviewed and is understood.  Full responsibility of the confidentiality of this discharge information lies with you and/or your care-partner.

## 2021-03-28 NOTE — Progress Notes (Signed)
Report given to PACU, vss 

## 2021-03-28 NOTE — Progress Notes (Signed)
Called to room to assist during endoscopic procedure.  Patient ID and intended procedure confirmed with present staff. Received instructions for my participation in the procedure from the performing physician.  

## 2021-03-28 NOTE — Op Note (Signed)
[place] Patient Name: Ann Gonzalez Procedure Date: 03/28/2021 2:12 PM MRN: [id number] Endoscopist: Thornton Park MD, MD Age: 45 Referring MD:  Date of Birth: 04-Jan-1976 Gender: Female Account #: [id number] Procedure:                Colonoscopy Indications:              Screening for colorectal malignant neoplasm, This                            is the patient's first colonoscopy Medicines:                Monitored Anesthesia Care Procedure:                Pre-Anesthesia Assessment:                           - Prior to the procedure, a History and Physical                            was performed, and patient medications and                            allergies were reviewed. The patient's tolerance of                            previous anesthesia was also reviewed. The risks                            and benefits of the procedure and the sedation                            options and risks were discussed with the patient.                            All questions were answered, and informed consent                            was obtained. Prior Anticoagulants: The patient has                            taken no previous anticoagulant or antiplatelet                            agents. ASA Grade Assessment: II - A patient with                            mild systemic disease. After reviewing the risks                            and benefits, the patient was deemed in                            satisfactory condition to undergo the procedure.  After obtaining informed consent, the colonoscope                            was passed under direct vision. Throughout the                            procedure, the patient's blood pressure, pulse, and                            oxygen saturations were monitored continuously. The                            CF [id number] was introduced through the anus                            and advanced to the  3 cm into the ileum. The                            colonoscopy was performed with moderate difficulty                            due to a redundant colon and significant looping.                            Successful completion of the procedure was aided by                            applying abdominal pressure. The patient tolerated                            the procedure well. The quality of the bowel                            preparation was good. The terminal ileum, ileocecal                            valve, appendiceal orifice, and rectum were                            photographed. Scope In: 2:26:42 PM Scope Out: 2:43:31 PM Scope Withdrawal Time: 0 hours 14 minutes 34 seconds  Total Procedure Duration: 0 hours 16 minutes 49 seconds  Findings:                 Non-bleeding external and internal hemorrhoids were                            found.                           A 3 mm polyp was found in the rectum. The polyp was                            flat. The polyp was removed with a cold snare.  Resection and retrieval were complete. Estimated                            blood loss was minimal.                           A 10 mm polyp was found in the ascending colon. The                            polyp was sessile. The polyp was removed with a                            piecemeal technique using a cold snare. Resection                            and retrieval were complete. Estimated blood loss                            was minimal.                           The exam was otherwise without abnormality on                            direct and retroflexion views. Complications:            No immediate complications. Estimated blood loss:                            Minimal. Estimated Blood Loss:     Estimated blood loss was minimal. Impression:               - Non-bleeding external and internal hemorrhoids.                           - One 3 mm polyp in the  rectum, removed with a cold                            snare. Resected and retrieved.                           - One 9 mm polyp in the ascending colon, removed                            piecemeal using a cold snare. Resected and                            retrieved.                           - The examination was otherwise normal on direct                            and retroflexion views. Recommendation:           - Patient has a contact number available  for                            emergencies. The signs and symptoms of potential                            delayed complications were discussed with the                            patient. Return to normal activities tomorrow.                            Written discharge instructions were provided to the                            patient.                           - Resume previous diet.                           - Continue present medications.                           - Await pathology results.                           - Repeat colonoscopy date to be determined after                            pending pathology results are reviewed for                            surveillance.                           - Emerging evidence supports eating a diet of                            fruits, vegetables, grains, calcium, and yogurt                            while reducing red meat and alcohol may reduce the                            risk of colon cancer.                           - Thank you for allowing me to be involved in your                            colon cancer prevention. Thornton Park MD, MD 03/28/2021 2:50:31 PM This report has been signed electronically.

## 2021-03-30 ENCOUNTER — Telehealth: Payer: Self-pay | Admitting: *Deleted

## 2021-03-30 NOTE — Telephone Encounter (Signed)
Follow up call made. 

## 2021-03-30 NOTE — Telephone Encounter (Signed)
  Follow up Call-  Call back number 03/28/2021  Post procedure Call Back phone  # [phone]  Permission to leave phone message Yes  Some recent data might be hidden     No answer at 2nd attempt follow up phone call.  Left message on voicemail.

## 2021-04-05 ENCOUNTER — Encounter: Payer: Self-pay | Admitting: Gastroenterology

## 2021-05-02 ENCOUNTER — Other Ambulatory Visit: Payer: Self-pay | Admitting: Internal Medicine

## 2021-06-10 ENCOUNTER — Ambulatory Visit
Admission: RE | Admit: 2021-06-10 | Discharge: 2021-06-10 | Disposition: A | Discharge status: Home / Self Care | Payer: BC Managed Care – PPO | Source: Ambulatory Visit | Attending: Adult Health | Admitting: Adult Health

## 2021-06-10 ENCOUNTER — Other Ambulatory Visit: Payer: Self-pay

## 2021-06-10 DIAGNOSIS — Z87891 Personal history of nicotine dependence: Secondary | ICD-10-CM

## 2021-06-10 DIAGNOSIS — R61 Generalized hyperhidrosis: Secondary | ICD-10-CM

## 2021-06-10 DIAGNOSIS — J8410 Pulmonary fibrosis, unspecified: Secondary | ICD-10-CM

## 2021-06-22 NOTE — Progress Notes (Signed)
Assessment and Plan:  Ann Gonzalez was seen today for results.  Diagnoses and all orders for this visit:  Polyarthralgia Family history of rheumatoid arthritis -     Sedimentation rate -     C-reactive protein -     Rheumatoid factor -     HLA-B27 antigen -     Cyclic citrul peptide antibody, IgG  Calcified granuloma of lung (HCC) Stable on 6 month follow up, neg quant gold; see details below Patient preference to discuss further with pulm -     Ambulatory referral to Pulmonology  Former smoker/dyspnea/abnormal CXR Former smoker with increased dyspnea following recent covid 19, also CXR with chronic bronchitic changes, referral for PFTs Also consider deconditioning, some anxiety may be contributing with atypical/not only exertional, discuss further after pulm workup Go to the ER if any chest pain, persistent shortness of breath, nausea, dizziness, severe HA, changes vision/speech -     Ambulatory referral to Pulmonology  Further disposition pending results of labs. Discussed med's effects and SE's.   Over 45 minutes of exam, counseling, chart review, and critical decision making was performed.   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   She is a former smoker (0.75 pack for 10 years, quit 2010); had CXR in 01/12/2021 due to night sweats showing tiny calcified granulomas; She has had several negative PPD, had negative quantiferon gold 02/26/2021. CXR was repeated 06/10/2021 that showed stable/unchanged. Did note some hyperinflation/chronic bronchitic changes.  She reports  had covid prior to her follow up CXR. Having some increased exertional dyspnea without wheezing, cough, productivity, CP in the last few months, though admits has been very sedentary and possible deconditioning. With her smoking hx and bronchitic changes in interested in seeing pulmonology.   On gabapentin for sleep and hot flashes/night sweats with improvement.   She is also concerned about ruling out possible autoimmune arthritis; mother was recently dx with RA, father with polymyositis. She reports long history of neck and upper back pain, has seen chiropractor; she has more recently noted more pain in bil hands, MCP and PIP joints with stiffness. Reports some fatigue but not debilitating.   Past Medical History:  Diagnosis Date   Costochondritis 11/25/2014   Dysplasia of cervix    GERD (gastroesophageal reflux disease)    during pregnancy   Headache(784.0)    migraines as teenager   HSV (herpes simplex virus) infection    Newborn product of IVF pregnancy    Prediabetes 11/25/2014   S/P cesarean section 09/15/2013     Allergies  Allergen Reactions   Wound Dressing Adhesive    Other Rash    Adhesive    Current Outpatient Medications on File Prior to Visit  Medication Sig   gabapentin (NEURONTIN) 100 MG capsule TAKE 1 TO 3 CAPSULES BY MOUTH AT BEDTIME FOR SLEEP OR HOT FLASHES   Multiple Vitamin (MULTI-VITAMINS) TABS Take by mouth.   valACYclovir (VALTREX) 500 MG tablet TK 1 T PO D   Calcium Carbonate-Vitamin D (CALCIUM + D PO) Take by mouth. Patient takes calcium 600 mg and Vitamin D10000 iu daily (Patient not taking:  No sig reported)   No current facility-administered medications on file prior to visit.   Allergies:  Allergies  Allergen Reactions   Wound Dressing Adhesive    Other Rash    Adhesive   Medical History:  has GERD (gastroesophageal reflux disease); Vitamin D deficiency; Medication management; Mixed hyperlipidemia; Elevated blood-pressure reading without  diagnosis of hypertension; Overweight (BMI 25.0-29.9); B12 deficiency; Left choroidal osteoma; Insomnia; Calcified granuloma of lung (HCC); and Night sweats on their problem list. Surgical History:  She  has a past surgical history that includes Wisdom tooth extraction; Septoplasty; Cryoablation; embryo transfer; and Cesarean section (N/A, 09/15/2013). Family History:  Herfamily history includes Cancer in her maternal grandfather; Colon cancer in her maternal grandfather; Diabetes in her maternal aunt; Diabetes type II in her mother and sister; Hypertension in her mother; Polymyositis in her father; Rheum arthritis in her mother; Stroke in her maternal grandmother; Tuberculosis in her maternal grandfather. Social History:   reports that she quit smoking about 12 years ago. Her smoking use included cigarettes. She has a 7.50 pack-year smoking history. She has never used smokeless tobacco. She reports current alcohol use of about 2.0 standard drinks per week. She reports that she does not use drugs.   ROS: all negative except above.   Physical Exam:  BP 130/82   Pulse (!) 55   Temp (!) 97.5 F (36.4 C)   Wt 173 lb (78.5 kg)   SpO2 98%   BMI 26.89 kg/m   General Appearance: Well nourished, in no apparent distress. Eyes: PERRLA, EOMs, conjunctiva no swelling or erythema Sinuses: No Frontal/maxillary tenderness ENT/Mouth: Ext aud canals clear, TMs without erythema, bulging. No erythema, swelling, or exudate on post pharynx.  Tonsils not swollen or erythematous. Hearing normal.  Neck: Supple, thyroid normal.  Respiratory: Respiratory effort normal, BS equal bilaterally without rales, rhonchi, wheezing or stridor.  Cardio: RRR with no MRGs. Brisk peripheral pulses without edema.  Abdomen: Soft, + BS.  Non tender, no guarding, rebound, hernias, masses. Lymphatics: Non tender without lymphadenopathy.  Musculoskeletal: Full ROM, 5/5 strength, normal gait.  Skin: Warm, dry without rashes,  lesions, ecchymosis.  Neuro: Cranial nerves intact. Normal muscle tone, no cerebellar symptoms. Sensation intact.  Psych: Awake and oriented X 3, normal affect, Insight and Judgment appropriate.     Izora Ribas, NP 12:51 PM [facility]

## 2021-06-23 ENCOUNTER — Ambulatory Visit (INDEPENDENT_AMBULATORY_CARE_PROVIDER_SITE_OTHER): Payer: BC Managed Care – PPO | Admitting: Adult Health

## 2021-06-23 ENCOUNTER — Other Ambulatory Visit: Payer: Self-pay

## 2021-06-23 ENCOUNTER — Encounter: Payer: Self-pay | Admitting: Adult Health

## 2021-06-23 VITALS — BP 130/82 | HR 55 | Temp 97.5°F | Wt 173.0 lb

## 2021-06-23 DIAGNOSIS — R9389 Abnormal findings on diagnostic imaging of other specified body structures: Secondary | ICD-10-CM

## 2021-06-23 DIAGNOSIS — R0600 Dyspnea, unspecified: Secondary | ICD-10-CM

## 2021-06-23 DIAGNOSIS — Z87891 Personal history of nicotine dependence: Secondary | ICD-10-CM

## 2021-06-23 DIAGNOSIS — M546 Pain in thoracic spine: Secondary | ICD-10-CM | POA: Diagnosis not present

## 2021-06-23 DIAGNOSIS — R5383 Other fatigue: Secondary | ICD-10-CM

## 2021-06-23 DIAGNOSIS — G8929 Other chronic pain: Secondary | ICD-10-CM

## 2021-06-23 DIAGNOSIS — M2550 Pain in unspecified joint: Secondary | ICD-10-CM | POA: Diagnosis not present

## 2021-06-23 DIAGNOSIS — Z8261 Family history of arthritis: Secondary | ICD-10-CM | POA: Diagnosis not present

## 2021-06-23 DIAGNOSIS — J8410 Pulmonary fibrosis, unspecified: Secondary | ICD-10-CM

## 2021-06-23 NOTE — Patient Instructions (Addendum)
  Antiinflammatory diet  Doctors are learning that one of the best ways to reduce inflammation lies not in the medicine cabinet, but in the refrigerator.  By following an anti-inflammatory diet you can fight off inflammation for good.  What does an anti-inflammatory diet do? Your immune system becomes activated when your body recognizes anything that is foreign--such as an invading microbe, plant pollen, or chemical. This often triggers a process called inflammation. Intermittent bouts of inflammation directed at truly threatening invaders protect your health.  However, sometimes inflammation persists, day in and day out, even when you are not threatened by a foreign invader. That's when inflammation can become your enemy. Many major diseases that plague us--including cancer, heart disease, diabetes, arthritis, depression, and Alzheimer's--have been linked to chronic inflammation.  One of the most powerful tools to combat inflammation comes not from the pharmacy, but from the grocery store.   Protect yourself from the damage of chronic inflammation. Science has proven that chronic, low-grade inflammation can turn into a silent killer that contributes to cardiovascular disease, cancer, type 2 diabetes and other conditions.   Choose the right anti-inflammatory foods, and you may be able to reduce your risk of illness. Consistently pick the wrong ones, and you could accelerate the inflammatory disease process.     Foods that cause inflammation Try to avoid or limit these foods as much as possible:  refined carbohydrates, such as white bread and pastries French fries and other fried foods soda and other sugar-sweetened beverages red meat (burgers, steaks) and processed meat (hot dogs, sausage) margarine, shortening, and lard  The health risks of inflammatory foods Not surprisingly, the same foods on an inflammation diet are generally considered bad for our health, including sodas and  refined carbohydrates, as well as red meat and processed meats.  "Some of the foods that have been associated with an increased risk for chronic diseases such as type 2 diabetes and heart disease are also associated with excess inflammation," Dr. Hu says. "It's not surprising, since inflammation is an important underlying mechanism for the development of these diseases."  Unhealthy foods also contribute to weight gain, which is itself a risk factor for inflammation. Yet in several studies, even after researchers took obesity into account, the link between foods and inflammation remained, which suggests weight gain isn't the sole driver. "Some of the food components or ingredients may have independent effects on inflammation over and above increased caloric intake," Dr. Hu says.  Anti-inflammatory foods An anti-inflammatory diet should include these foods:  tomatoes olive oil green leafy vegetables, such as spinach, kale, and collards nuts like almonds and walnuts fatty fish like salmon, mackerel, tuna, and sardines fruits such as strawberries, blueberries, cherries, and oranges  Benefits of anti-inflammatory foods On the flip side are beverages and foods that reduce inflammation, in particular fruits and vegetables such as blueberries, apples, and leafy greens that are high in natural antioxidants and polyphenols--protective compounds found in plants.  Studies have also associated nuts with reduced markers of inflammation and a lower risk of cardiovascular disease and diabetes. Coffee, which contains polyphenols and other anti-inflammatory compounds, may protect against inflammation, as well.  Anti-inflammatory diet To reduce levels of inflammation, aim for an overall healthy diet. If you're looking for an eating plan that closely follows the tenets of anti-inflammatory eating, consider the Mediterranean diet, which is high in fruits, vegetables, nuts, whole grains, fish, and healthy  oils.  In addition to lowering inflammation, a more natural, less processed diet can   have noticeable effects on your physical and emotional health and overall quality of life.   https://www.health.[employer].edu/staying-healthy/foods-that-fight-inflammation  

## 2021-06-24 LAB — C-REACTIVE PROTEIN: CRP: 1.7 mg/L (ref <8.0)

## 2021-06-24 LAB — CYCLIC CITRUL PEPTIDE ANTIBODY, IGG: Cyclic Citrullin Peptide Ab: <16 UNITS

## 2021-06-24 LAB — SEDIMENTATION RATE: Sed Rate: 6 mm/h (ref 0–20)

## 2021-06-24 LAB — RHEUMATOID FACTOR: Rheumatoid fact SerPl-aCnc: <14 IU/mL (ref <14)

## 2021-06-24 LAB — HLA-B27 ANTIGEN: HLA-B27 Antigen: NEGATIVE

## 2021-07-07 ENCOUNTER — Other Ambulatory Visit: Payer: Self-pay

## 2021-07-07 ENCOUNTER — Encounter: Payer: Self-pay | Admitting: Internal Medicine

## 2021-07-07 ENCOUNTER — Ambulatory Visit: Payer: BC Managed Care – PPO | Admitting: Internal Medicine

## 2021-07-07 VITALS — BP 112/78 | HR 76 | Temp 98.1°F | Ht 67.5 in | Wt 176.0 lb

## 2021-07-07 DIAGNOSIS — J8410 Pulmonary fibrosis, unspecified: Secondary | ICD-10-CM | POA: Diagnosis not present

## 2021-07-07 DIAGNOSIS — R0602 Shortness of breath: Secondary | ICD-10-CM | POA: Diagnosis not present

## 2021-07-07 NOTE — Patient Instructions (Addendum)
Come back to see me as needed if your breathing changes.   Call back and we will schedule you for breathing testing.

## 2021-07-07 NOTE — Progress Notes (Signed)
Ann Gonzalez    [id number]    [birth year]  Primary Care Physician:McKeown, Gwyndolyn Saxon, MD  Referring Physician: Liane Comber, NP [address] [place],  [place] [address] Reason for Consultation: trouble breathing at night Date of Consultation: 07/07/2021  Chief complaint:   Chief Complaint  Patient presents with   Consult    Abnormal cxr     HPI: Ann Gonzalez is a 45 y.o. woman who is here for new patient evaluation of dyspnea and abnormal chest ray.   She had a chest xray done by PCP which showed punctate granulomas. Also showed hyperinflation and chronic bronchitis.  She had covid in August 2022.   She notes more recently she has become aware of difficulty breathing and has trouble taking a deep breath in. Associated with palpitations. No chest tightness cough or wheezing. No dyspnea which prevents completion of ADLs. Symptoms are almost always at rest, rather than with exertion.   She had bronchitis several times, but nothing in the last couple of years. No childhood respiratory disease, just some allergies.    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 Grew up in [place] Smoking history: former smoker, 20 years, 0.75 ppd. Had passive smoke exposure in parents grandparents.   Social History   Occupational History   Not on file  Tobacco Use   Smoking status: Former    Packs/day: 0.75    Years: 10.00    Pack years: 7.50    Types: Cigarettes    Quit date: 09/12/2008    Years since quitting: 12.8   Smokeless tobacco: Never  Vaping Use   Vaping Use: Never used  Substance and Sexual Activity   Alcohol use: Yes    Alcohol/week: 2.0 standard drinks    Types: 2 Glasses of wine per week   Drug use: No   Sexual activity: Yes    Partners: Male    Birth control/protection: None    Relevant family history:  Family History  Problem Relation Age of  Onset   Rheum arthritis Mother        49s   Diabetes type II Mother    Hypertension Mother    Polymyositis Father        85s   Diabetes type II Sister    Stroke Maternal Grandmother    Colon cancer Maternal Grandfather        70's   Cancer Maternal Grandfather    Tuberculosis Maternal Grandfather    Diabetes Maternal Aunt    Esophageal cancer Neg Hx    Stomach cancer Neg Hx    Rectal cancer Neg Hx    Colon polyps Neg Hx     Past Medical History:  Diagnosis Date   Costochondritis 11/25/2014   Dysplasia of cervix    GERD (gastroesophageal reflux disease)    during pregnancy   Headache(784.0)    migraines as teenager   HSV (herpes simplex virus) infection    Newborn product of IVF pregnancy    Prediabetes 11/25/2014   S/P cesarean section 09/15/2013    Past Surgical History:  Procedure Laterality Date   CESAREAN SECTION N/A 09/15/2013   Procedure: CESAREAN SECTION;  Surgeon: Marylynn Pearson, MD;  Location: [facility];  Service: Obstetrics;  Laterality: N/A;  PRIMARY EDC 1/20   CRYOABLATION     embryo transfer     SEPTOPLASTY     WISDOM TOOTH  EXTRACTION       Physical Exam: Blood pressure 112/78, pulse 76, temperature 98.1 F (36.7 C), height 5' 7.5" (1.715 m), weight 176 lb (79.8 kg), SpO2 100 %, unknown if currently breastfeeding. Gen:      No acute distress ENT:  no nasal polyps, mucus membranes moist Lungs:    No increased respiratory effort, symmetric chest wall excursion, clear to auscultation bilaterally, no wheezes or crackles CV:         Regular rate and rhythm; no murmurs, rubs, or gallops.  No pedal edema Abd:      + bowel sounds; soft, non-tender; no distension MSK: no acute synovitis of DIP or PIP joints, no mechanics hands.  Skin:      Warm and dry; no rashes Neuro: normal speech, no focal facial asymmetry Psych: alert and oriented x3, normal mood and affect   Data Reviewed/Medical Decision Making:  Independent interpretation of tests: Imaging:  Review  of patient's chet xray October 2022 images showed normal lungs except a few punctate calcified granulomas. The patient's images have been independently reviewed by me.  Unchanged between May and August.   PFTs:  No flowsheet data found.  Labs:  Lab Results  Component Value Date   WBC 7.3 01/12/2021   HGB 12.9 01/12/2021   HCT 39.6 01/12/2021   MCV 98.0 01/12/2021   PLT 280 01/12/2021   Lab Results  Component Value Date   NA 140 01/12/2021   K 4.3 01/12/2021   CL 105 01/12/2021   CO2 24 01/12/2021    Immunization status:  Immunization History  Administered Date(s) Administered   Influenza-Unspecified 07/01/2020   PFIZER(Purple Top)SARS-COV-2 Vaccination 10/24/2019, 11/18/2019, 07/01/2020   PPD Test 12/09/2015, 03/26/2017, 09/30/2020   Tdap 08/28/2006, 01/26/2013   Unspecified SARS-COV-2 Vaccination 07/01/2020     I reviewed prior external note(s) from PCP  I reviewed the result(s) of the labs and imaging as noted above.   I have ordered PFT   Assessment:  Shortness of breath Palpitations Benign Punctate Granulomas -- no further follow up needed.  History of tobacco use  Plan/Recommendations:  Reviewed patient's imaging with her. No further follow up is needed for these punctate granulomas which are of a benign etiology.   Regarding her shortness of breath she is at risk for smoking related lung disease given her history, but her symptoms are atypical for this. We discussed obtaining PFTs to further quantify her dyspnea. She has some work up for palpitations that she would like to pursue and will follow up with [place] after that is completed. She will call to schedule.   We discussed disease management and progression at length today.     Return to Care: As needed or after PFTs.   Lenice Llamas, MD [facility] [place]  CC: Liane Comber, NP

## 2021-08-06 ENCOUNTER — Other Ambulatory Visit: Payer: Self-pay | Admitting: Nurse Practitioner

## 2021-11-08 ENCOUNTER — Other Ambulatory Visit: Payer: Self-pay | Admitting: Adult Health

## 2022-01-05 IMAGING — CR DG CHEST 2V
2 series · 2 of 2 positions shown · non-contrast
Comparison: No priors.

CLINICAL DATA: 44-year-old female former smoker presenting with
night sweats.

EXAM:
CHEST - 2 VIEW

[w chest pa]
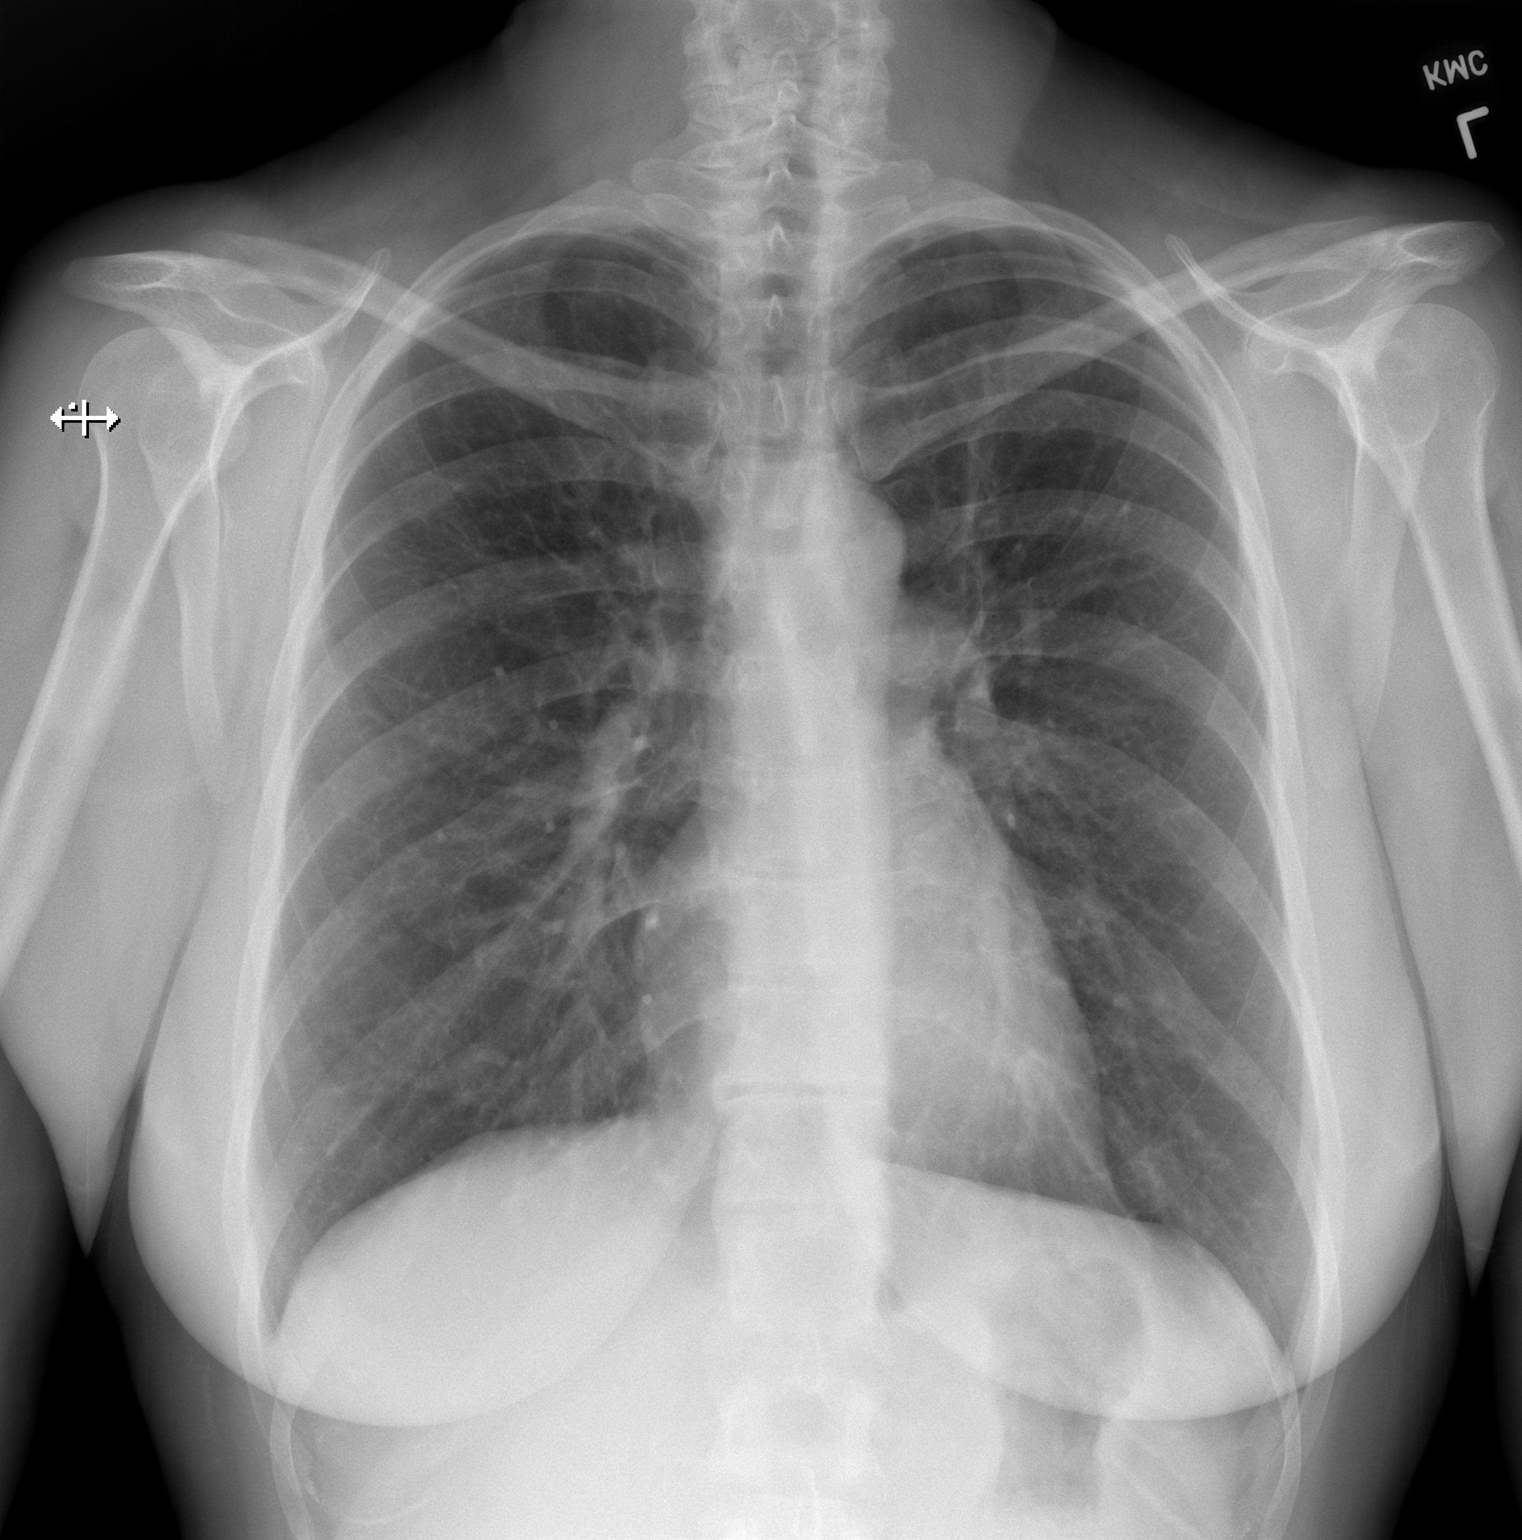

[w chest lat]
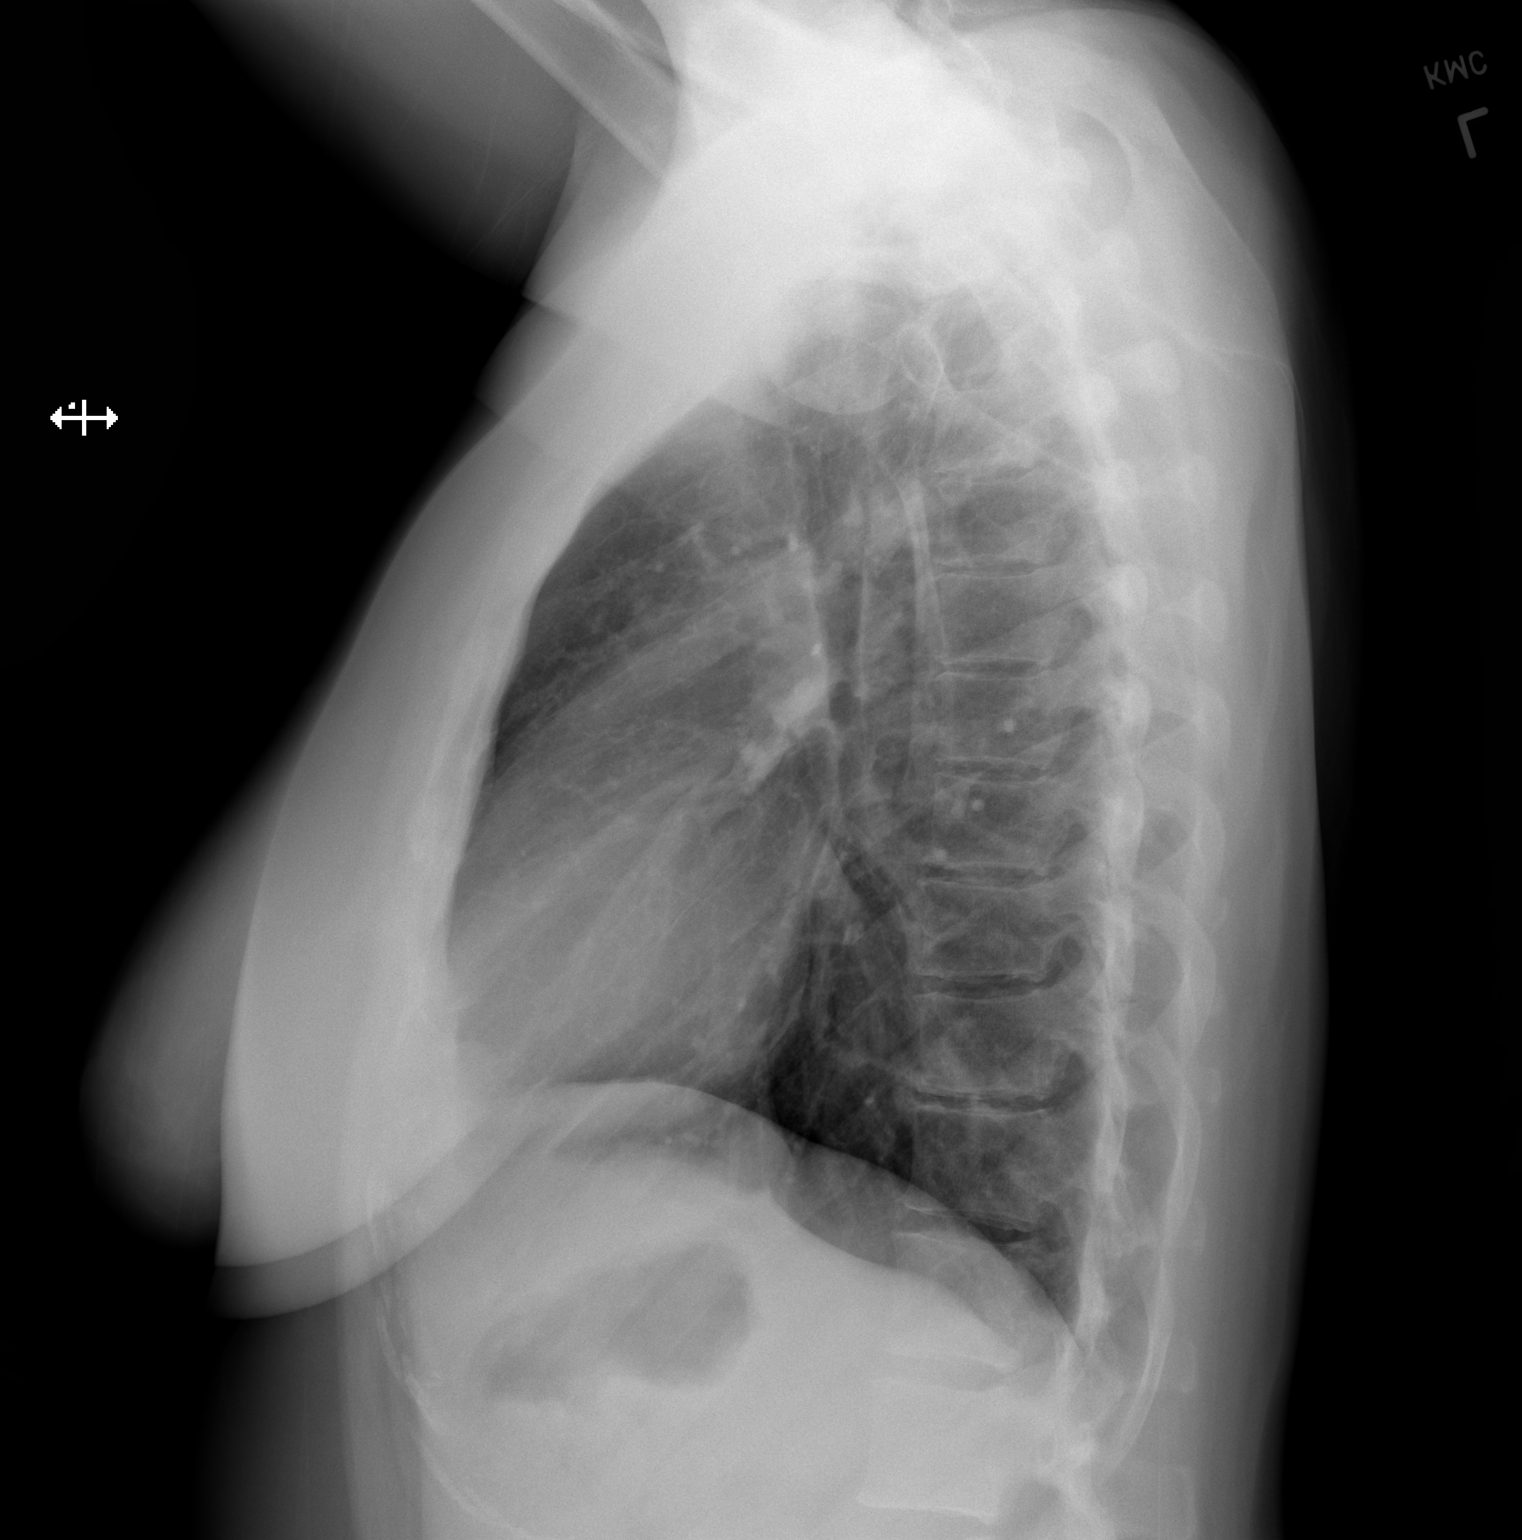

[2 of 2 positions shown; findings below may reference images not displayed]

FINDINGS: Lung volumes are normal. No consolidative airspace disease. No
pleural effusions. No pneumothorax. Multiple tiny dense nodules
scattered throughout the lungs bilaterally, indicative of calcified
granulomas. No other suspicious appearing pulmonary nodule or mass
noted. Pulmonary vasculature and the cardiomediastinal silhouette
are within normal limits.
IMPRESSION: 1. Multiple tiny calcified granulomas scattered throughout the lungs
bilaterally. No radiographic evidence of acute cardiopulmonary
disease.

## 2022-01-12 ENCOUNTER — Ambulatory Visit (INDEPENDENT_AMBULATORY_CARE_PROVIDER_SITE_OTHER): Payer: BC Managed Care – PPO | Admitting: Adult Health

## 2022-01-12 ENCOUNTER — Ambulatory Visit (INDEPENDENT_AMBULATORY_CARE_PROVIDER_SITE_OTHER): Payer: BC Managed Care – PPO

## 2022-01-12 ENCOUNTER — Encounter: Payer: Self-pay | Admitting: Adult Health

## 2022-01-12 ENCOUNTER — Other Ambulatory Visit: Payer: Self-pay | Admitting: Adult Health

## 2022-01-12 VITALS — BP 132/80 | HR 75 | Temp 96.9°F | Ht 67.25 in | Wt 175.0 lb

## 2022-01-12 DIAGNOSIS — E559 Vitamin D deficiency, unspecified: Secondary | ICD-10-CM

## 2022-01-12 DIAGNOSIS — E782 Mixed hyperlipidemia: Secondary | ICD-10-CM

## 2022-01-12 DIAGNOSIS — Z1329 Encounter for screening for other suspected endocrine disorder: Secondary | ICD-10-CM

## 2022-01-12 DIAGNOSIS — Z13220 Encounter for screening for lipoid disorders: Secondary | ICD-10-CM | POA: Diagnosis not present

## 2022-01-12 DIAGNOSIS — E663 Overweight: Secondary | ICD-10-CM

## 2022-01-12 DIAGNOSIS — R002 Palpitations: Secondary | ICD-10-CM

## 2022-01-12 DIAGNOSIS — Z131 Encounter for screening for diabetes mellitus: Secondary | ICD-10-CM | POA: Diagnosis not present

## 2022-01-12 DIAGNOSIS — D164 Benign neoplasm of bones of skull and face: Secondary | ICD-10-CM

## 2022-01-12 DIAGNOSIS — Z1389 Encounter for screening for other disorder: Secondary | ICD-10-CM | POA: Diagnosis not present

## 2022-01-12 DIAGNOSIS — K219 Gastro-esophageal reflux disease without esophagitis: Secondary | ICD-10-CM

## 2022-01-12 DIAGNOSIS — Z0001 Encounter for general adult medical examination with abnormal findings: Secondary | ICD-10-CM

## 2022-01-12 DIAGNOSIS — Z79899 Other long term (current) drug therapy: Secondary | ICD-10-CM

## 2022-01-12 DIAGNOSIS — I10 Essential (primary) hypertension: Secondary | ICD-10-CM | POA: Diagnosis not present

## 2022-01-12 DIAGNOSIS — Z0000 Encounter for general adult medical examination without abnormal findings: Secondary | ICD-10-CM | POA: Diagnosis not present

## 2022-01-12 DIAGNOSIS — Z136 Encounter for screening for cardiovascular disorders: Secondary | ICD-10-CM | POA: Diagnosis not present

## 2022-01-12 DIAGNOSIS — G4700 Insomnia, unspecified: Secondary | ICD-10-CM

## 2022-01-12 DIAGNOSIS — Z860100 Personal history of colon polyps, unspecified: Secondary | ICD-10-CM

## 2022-01-12 DIAGNOSIS — R232 Flushing: Secondary | ICD-10-CM

## 2022-01-12 DIAGNOSIS — E538 Deficiency of other specified B group vitamins: Secondary | ICD-10-CM

## 2022-01-12 DIAGNOSIS — R7309 Other abnormal glucose: Secondary | ICD-10-CM

## 2022-01-12 NOTE — Progress Notes (Unsigned)
Enrolled for [employer] to mail a ZIO XT long term holter monitor to the patients address on file.  

## 2022-01-12 NOTE — Patient Instructions (Addendum)
Ann Gonzalez , Thank you for taking time to come for your Annual Wellness Visit. I appreciate your ongoing commitment to your health goals. Please review the following plan we discussed and let me know if I can assist you in the future.   These are the goals we discussed:  Goals      Blood Pressure < 130/80     Exercise 150 min/wk Moderate Activity     Weight (lb) < 160 lb (72.6 kg)        This is a list of the screening recommended for you and due dates:  Health Maintenance  Topic Date Due   Pap Smear  12/23/2017   COVID-19 Vaccine (6 - Booster for Pfizer series) 01/28/2022*   Flu Shot  03/28/2022   Tetanus Vaccine  01/27/2023   Colon Cancer Screening  03/28/2024   HIV Screening  Completed   HPV Vaccine  Aged Out   Hepatitis C Screening: USPSTF Recommendation to screen - Ages 18-79 yo.  Discontinued  *Topic was postponed. The date shown is not the original due date.    Suggest getting on at least 5000 IU daily for vitamin D (ok to take extra if you miss a dose)  Suggest starting b12 sublingual (dissolves under tongue) 500 mcg daily - will recheck next year     Know what a healthy weight is for you (roughly BMI <25) and aim to maintain this  Aim for 7+ servings of fruits and vegetables daily  65-80+ fluid ounces of water or unsweet tea for healthy kidneys  Limit to max 1 drink of alcohol per day; avoid smoking/tobacco  Limit animal fats in diet for cholesterol and heart health - choose grass fed whenever available  Avoid highly processed foods, and foods high in saturated/trans fats  Aim for low stress - take time to unwind and care for your mental health  Aim for 150 min of moderate intensity exercise weekly for heart health, and weights twice weekly for bone health  Aim for 7-9 hours of sleep daily      HYPERTENSION INFORMATION  Monitor your blood pressure at home, please keep a record and bring that in with you to your next office visit.   Go to the ER  if any CP, SOB, nausea, dizziness, severe HA, changes vision/speech  Testing/Procedures: HOW TO TAKE YOUR BLOOD PRESSURE: Rest 5 minutes before taking your blood pressure. Don't smoke or drink caffeinated beverages for at least 30 minutes before. Take your blood pressure before (not after) you eat. Sit comfortably with your back supported and both feet on the floor (don't cross your legs). Elevate your arm to heart level on a table or a desk. Use the proper sized cuff. It should fit smoothly and snugly around your bare upper arm. There should be enough room to slip a fingertip under the cuff. The bottom edge of the cuff should be 1 inch above the crease of the elbow.  Your most recent BP: BP: 132/80   Take your medications faithfully as instructed. Maintain a healthy weight. Get at least 150 minutes of aerobic exercise per week. Minimize salt intake. Minimize alcohol intake  DASH Eating Plan DASH stands for "Dietary Approaches to Stop Hypertension." The DASH eating plan is a healthy eating plan that has been shown to reduce high blood pressure (hypertension). Additional health benefits may include reducing the risk of type 2 diabetes mellitus, heart disease, and stroke. The DASH eating plan may also help with weight loss. WHAT  DO I NEED TO KNOW ABOUT THE DASH EATING PLAN? For the DASH eating plan, you will follow these general guidelines: Choose foods with a percent daily value for sodium of less than 5% (as listed on the food label). Use salt-free seasonings or herbs instead of table salt or sea salt. Check with your health care provider or pharmacist before using salt substitutes. Eat lower-sodium products, often labeled as "lower sodium" or "no salt added." Eat fresh foods. Eat more vegetables, fruits, and low-fat dairy products. Choose whole grains. Look for the word "whole" as the first word in the ingredient list. Choose fish and skinless chicken or [place] more often than red  meat. Limit fish, poultry, and meat to 6 oz (170 g) each day. Limit sweets, desserts, sugars, and sugary drinks. Choose heart-healthy fats. Limit cheese to 1 oz (28 g) per day. Eat more home-cooked food and less restaurant, buffet, and fast food. Limit fried foods. Cook foods using methods other than frying. Limit canned vegetables. If you do use them, rinse them well to decrease the sodium. When eating at a restaurant, ask that your food be prepared with less salt, or no salt if possible. WHAT FOODS CAN I EAT? Seek help from a dietitian for individual calorie needs. Grains Whole grain or whole wheat bread. Brown rice. Whole grain or whole wheat pasta. Quinoa, bulgur, and whole grain cereals. Low-sodium cereals. Corn or whole wheat flour tortillas. Whole grain cornbread. Whole grain crackers. Low-sodium crackers. Vegetables Fresh or frozen vegetables (raw, steamed, roasted, or grilled). Low-sodium or reduced-sodium tomato and vegetable juices. Low-sodium or reduced-sodium tomato sauce and paste. Low-sodium or reduced-sodium canned vegetables.  Fruits All fresh, canned (in natural juice), or frozen fruits. Meat and Other Protein Products Ground beef (85% or leaner), grass-fed beef, or beef trimmed of fat. Skinless chicken or [place]. Ground chicken or [place]. Pork trimmed of fat. All fish and seafood. Eggs. Dried beans, peas, or lentils. Unsalted nuts and seeds. Unsalted canned beans. Dairy Low-fat dairy products, such as skim or 1% milk, 2% or reduced-fat cheeses, low-fat ricotta or cottage cheese, or plain low-fat yogurt. Low-sodium or reduced-sodium cheeses. Fats and Oils Tub margarines without trans fats. Light or reduced-fat mayonnaise and salad dressings (reduced sodium). Avocado. Safflower, olive, or canola oils. Natural peanut or almond butter. Other Unsalted popcorn and pretzels. The items listed above may not be a complete list of recommended foods or beverages. Contact your  dietitian for more options. WHAT FOODS ARE NOT RECOMMENDED? Grains White bread. White pasta. White rice. Refined cornbread. Bagels and croissants. Crackers that contain trans fat. Vegetables Creamed or fried vegetables. Vegetables in a cheese sauce. Regular canned vegetables. Regular canned tomato sauce and paste. Regular tomato and vegetable juices. Fruits Dried fruits. Canned fruit in light or heavy syrup. Fruit juice. Meat and Other Protein Products Fatty cuts of meat. Ribs, chicken wings, bacon, sausage, bologna, salami, chitterlings, fatback, hot dogs, bratwurst, and packaged luncheon meats. Salted nuts and seeds. Canned beans with salt. Dairy Whole or 2% milk, cream, half-and-half, and cream cheese. Whole-fat or sweetened yogurt. Full-fat cheeses or blue cheese. Nondairy creamers and whipped toppings. Processed cheese, cheese spreads, or cheese curds. Condiments Onion and garlic salt, seasoned salt, table salt, and sea salt. Canned and packaged gravies. Worcestershire sauce. Tartar sauce. Barbecue sauce. Teriyaki sauce. Soy sauce, including reduced sodium. Steak sauce. Fish sauce. Oyster sauce. Cocktail sauce. Horseradish. Ketchup and mustard. Meat flavorings and tenderizers. Bouillon cubes. Hot sauce. Tabasco sauce. Marinades. Taco seasonings. Relishes. Fats and  Oils Butter, stick margarine, lard, shortening, ghee, and bacon fat. Coconut, palm kernel, or palm oils. Regular salad dressings. Other Pickles and olives. Salted popcorn and pretzels. The items listed above may not be a complete list of foods and beverages to avoid. Contact your dietitian for more information. WHERE CAN I FIND MORE INFORMATION? National Heart, Lung, and Blood Institute: travelstabloid.com Document Released: 08/03/2011 Document Revised: 12/29/2013 Document Reviewed: 06/18/2013 [facility] Patient Information 2015 [place], [place]. This information is not intended to replace advice  given to you by your health care provider. Make sure you discuss any questions you have with your health care provider.

## 2022-01-12 NOTE — Progress Notes (Signed)
Complete Physical  Assessment and Plan:  Ann Gonzalez was seen today for annual exam.  Diagnoses and all orders for this visit:  Encounter for Annual Physical Exam with abnormal findings Due annually  Health Maintenance reviewed Healthy lifestyle reviewed and goals set Forward GYN reports  Hypertension Borderline; Monitor blood pressure at home; call if consistently over 130/80 Reviewed and given DASH diet information -     CBC with Differential/Platelet -     COMPLETE METABOLIC PANEL WITH GFR -     Magnesium -     TSH -     Microalbumin / creatinine urine ratio -     Urinalysis, Routine w reflex microscopic -     EKG 12-Lead   Gastroesophageal reflux disease, unspecified whether esophagitis present Well managed by lifestyle Discussed diet, avoiding triggers and other lifestyle changes  Mixed hyperlipidemia Currently well controlled by lifestyle Continue low cholesterol diet and exercise.  Check lipid panel.  -     Lipid panel -     TSH  Other abnormal glucose/ Family history of diabetes Discussed disease and risks Discussed diet/exercise, weight management  -     COMPLETE METABOLIC PANEL WITH GFR -     Hemoglobin A1c  Overweight (BMI 25.0-29.9) Long discussion about weight loss, diet, and exercise Recommended diet heavy in fruits and veggies and low in animal meats, cheeses, and dairy products, appropriate calorie intake Patient will work on increasing exercise, high fiber minimally processed plant based diet Discussed appropriate weight for height, initial goal < 160 lb Follow up at next visit  Vitamin D deficiency -     VITAMIN D 25 Hydroxy (Vit-D Deficiency, Fractures)  B12 deficiency -     Vitamin B12  Medication management -     CBC with Differential/Platelet -     COMPLETE METABOLIC PANEL WITH GFR -     Magnesium  Screening for thyroid disorder -     TSH  Screening for hematuria or proteinuria -     Microalbumin / creatinine urine ratio -      Urinalysis, Routine w reflex microscopic  Screening for cardiovascular condition -     EKG 12-Lead  Disorder of choroid Benign; specialist monitors annually   Personal history of colon polyps Next due 2025 per Dr. Tarri Glenn Discussed low processed/red meat, high fiber diet  Insomnia/ hot flashes Much improved with gabapentin;  -     gabapentin (NEURONTIN) 100 MG capsule; Take 1-3 caps 1 hour prior to bedtime for sleep and hot flashes.  Palpitations ? Stress, recheck labs, did have normal checks last year EKG normal Ordered holter after discussion If benign encourage stress management interventions, low caffeine, monitor  Orders Placed This Encounter  Procedures   CBC with Differential/Platelet   COMPLETE METABOLIC PANEL WITH GFR   Magnesium   Lipid panel   TSH   Hemoglobin A1c   VITAMIN D 25 Hydroxy (Vit-D Deficiency, Fractures)   Microalbumin / creatinine urine ratio   Urinalysis, Routine w reflex microscopic   EKG 12-Lead      Discussed med's effects and SE's. Screening labs and tests as requested with regular follow-up as recommended. Over 40 minutes of exam, counseling, chart review, and complex, high level critical decision making was performed this visit.   Future Appointments  Date Time Provider [place]  01/16/2023 10:00 AM Darrol Jump, NP [facility] None     HPI  46 y.o. female  presents for a complete physical and follow up for has GERD (gastroesophageal reflux  disease); Vitamin D deficiency; Medication management; Mixed hyperlipidemia; Hypertension; Overweight (BMI 25.0-29.9); B12 deficiency; Left choroidal osteoma; Insomnia; Hot flashes; History of colon polyps; and Palpitations on their problem list.   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 Did have a stressful year.   [facility] (PAP/mammogram in office, hx of abnormal PAP was  recommended repeat this year), had C section at age 65, perimenopausal. She was having insomnia and night time hot flashes, taking gabapentin 200 mg at night with good results.   CXR in 2022 due to dyspnea dn remote smoking (7.5 pack year, quit 2010) showed calcified granuloma, stable on recheck, saw pulm who concluded benign etiology and wasn't recommended for further workup.   GERD - recently well controlled by of lifestyle.   BMI is Body mass index is 27.21 kg/m., she has been working on diet and exercise but admits could do better.  Wt Readings from Last 3 Encounters:  01/12/22 175 lb (79.4 kg)  07/07/21 176 lb (79.8 kg)  06/23/21 173 lb (78.5 kg)   Today their BP is BP: 132/80 She does workout. She denies chest pain, shortness of breath, dizziness.   She reports ongoing palpitations, more frequently at night, doesn't correlate with nightmares, will wake her up, will have occasionally during the day as well without dyspnea, CP or dizziness.   She is not on cholesterol medication, hx of elevations improved with lifestyle modification. Her cholesterol is at goal. The cholesterol last visit was:   Lab Results  Component Value Date   CHOL 206 (H) 01/12/2021   HDL 76 01/12/2021   LDLCALC 102 (H) 01/12/2021   TRIG 161 (H) 01/12/2021   CHOLHDL 2.7 01/12/2021   She has been working on diet and exercise for hx of prediabetes (A1C 5.8% in 2016), denies increased appetite, nausea, paresthesia of the feet, polydipsia and polyuria. Last A1C in the office was:  Lab Results  Component Value Date   HGBA1C 5.4 01/12/2021   Last GFR: Lab Results  Component Value Date   GFRNONAA 100 01/12/2021   Patient is not currently on Vitamin D supplement, inconsistent, ? Was taking 5000 IU at one point  Lab Results  Component Value Date   VD25OH 30 01/12/2021     B12 was very low last year, never started supplement, receptive to starting 500 mcg SL daily Lab Results  Component Value Date    VITAMINB12 268 01/12/2021     Current Medications:  Current Outpatient Medications on File Prior to Visit  Medication Sig Dispense Refill   Calcium Carbonate-Vitamin D (CALCIUM + D PO) Take by mouth. Patient takes calcium 600 mg and Vitamin D10000 iu daily     gabapentin (NEURONTIN) 100 MG capsule TAKE 1 TO 3 CAPSULES BY MOUTH AT BEDTIME FOR SLEEP OR HOT FLASHES 270 capsule 1   Multiple Vitamin (MULTI-VITAMINS) TABS Take by mouth.     valACYclovir (VALTREX) 500 MG tablet TK 1 T PO D  6   No current facility-administered medications on file prior to visit.   Allergies:  Allergies  Allergen Reactions   Wound Dressing Adhesive    Other Rash    Adhesive   Medical History:  She has GERD (gastroesophageal reflux disease); Vitamin D deficiency; Medication management; Mixed hyperlipidemia; Hypertension; Overweight (BMI 25.0-29.9); B12 deficiency; Left choroidal osteoma; Insomnia; Hot flashes; History of colon polyps; and Palpitations on their problem list.  Health Maintenance:  Immunization History  Administered Date(s) Administered   Influenza-Unspecified 07/01/2020, 05/22/2021   PFIZER Comirnaty(Gray Top)Covid-19 Tri-Sucrose Vaccine 05/22/2021   PFIZER(Purple Top)SARS-COV-2 Vaccination 10/24/2019, 11/18/2019, 07/01/2020   PPD Test 12/09/2015, 03/26/2017, 09/30/2020   Tdap 08/28/2006, 01/26/2013   Unspecified SARS-COV-2 Vaccination 07/01/2020   Health Maintenance  Topic Date Due   PAP SMEAR-Modifier  12/23/2017   COVID-19 Vaccine (6 - Booster for [place] series) 01/28/2022 (Originally 07/17/2021)   INFLUENZA VACCINE  03/28/2022   TETANUS/TDAP  01/27/2023   COLONOSCOPY (Pts 45-26yr Insurance coverage will need to be confirmed)  03/28/2024   HIV Screening  Completed   HPV VACCINES  Aged Out   Hepatitis C Screening  Discontinued   Tetanus: 2014 when pregnant, plan in 2023 Shingrix: age [age]Covid 132 2/2, pfizer has had booster  LMP: No LMP recorded. (Menstrual status:  Perimenopausal). Pap: goes anually to GYN, - will request report MGM: gets annually at GYN office - will request report  DEXA:  Colonoscopy: 03/28/2021, 2 polyps, serrated, 3 year recall per Dr. BTarri Glenn Last Dental Exam: goes q6108mlast 2023, Dr. BeClaris Cheast Eye Exam: L choroidal osteoma, benign, monitoring only, Dr. SaBaird Canceretinal specialist follows annually, also with regular eye doctor annually (Dr. WaGilford Rileannually  Patient Care Team: McUnk PintoMD as PCP - General (Internal Medicine)  Surgical History:  She has a past surgical history that includes Wisdom tooth extraction; Septoplasty; Cryoablation; embryo transfer; and Cesarean section (N/A, 09/15/2013). Family History:  Herfamily history includes Cancer in her maternal grandfather; Chronic bronchitis in her mother; Colon cancer in her maternal grandfather; Diabetes in her maternal aunt; Diabetes type II in her mother and sister; Hypertension in her mother; Polymyositis in her father; Rheum arthritis in her mother; Stroke in her maternal grandmother; Tuberculosis in her maternal grandfather. Social History:  She reports that she quit smoking about 13 years ago. Her smoking use included cigarettes. She has a 7.50 pack-year smoking history. She has never used smokeless tobacco. She reports current alcohol use of about 2.0 standard drinks per week. She reports that she does not use drugs.  Review of Systems: Review of Systems  Constitutional:  Negative for diaphoresis (improved with gabapentin), malaise/fatigue and weight loss.  HENT:  Negative for hearing loss and tinnitus.   Eyes:  Negative for blurred vision and double vision.  Respiratory:  Negative for cough, shortness of breath and wheezing.   Cardiovascular:  Positive for palpitations (persistent over 1 year, no accompaniments, "fluttering" abrupt). Negative for chest pain, orthopnea, claudication and leg swelling.  Gastrointestinal:  Negative for abdominal pain, blood in  stool, constipation, diarrhea, heartburn, melena, nausea and vomiting.  Genitourinary: Negative.   Musculoskeletal:  Negative for joint pain and myalgias.  Skin:  Negative for rash.  Neurological:  Negative for dizziness, tingling, sensory change, weakness and headaches.  Endo/Heme/Allergies:  Negative for polydipsia.  Psychiatric/Behavioral:  Negative for depression, memory loss, substance abuse and suicidal ideas. The patient is not nervous/anxious and does not have insomnia (improved with gabapentin).   All other systems reviewed and are negative.  Physical Exam: Estimated body mass index is 27.21 kg/m as calculated from the following:   Height as of this encounter: 5' 7.25" (1.708 m).   Weight as of this encounter: 175 lb (79.4 kg). BP 132/80   Pulse 75   Temp (!) 96.9 F (36.1 C)   Ht 5' 7.25" (1.708 m)   Wt 175 lb (79.4 kg)   SpO2 99%   BMI 27.21 kg/m  General Appearance:  Well nourished, in no apparent distress.  Eyes: PERRLA, EOMs, conjunctiva no swelling or erythema Sinuses: No Frontal/maxillary tenderness  ENT/Mouth: Ext aud canals clear, normal light reflex with TMs without erythema, bulging. Good dentition. No erythema, swelling, or exudate on post pharynx. Tonsils not swollen or erythematous. Hearing normal.  Neck: Supple, thyroid normal. No bruits  Respiratory: Respiratory effort normal, BS equal bilaterally without rales, rhonchi, wheezing or stridor.  Cardio: RRR without murmurs, rubs or gallops. Brisk peripheral pulses without edema.  Chest: symmetric, with normal excursions and percussion.  Breasts: Defer to GYN Abdomen: Soft, nontender, no guarding, rebound, hernias, masses, or organomegaly.  Lymphatics: Non tender without lymphadenopathy.  Genitourinary: defer to GYN Musculoskeletal: Full ROM all peripheral extremities,5/5 strength, and normal gait.  Skin: Warm, dry without rashes, lesions, ecchymosis. Neuro: Cranial nerves intact, reflexes equal bilaterally.  Normal muscle tone, no cerebellar symptoms. Sensation intact.  Psych: Awake and oriented X 3, normal affect, Insight and Judgment appropriate.   EKG: NSR, NSCPT  Izora Ribas, DNP, AGNP-C 11:35 AM [facility]

## 2022-01-13 LAB — CBC WITH DIFFERENTIAL/PLATELET
Absolute Monocytes: 502 cells/uL (ref 200–950)
Basophils Absolute: 30 cells/uL (ref 0–200)
Basophils Relative: 0.4 %
Eosinophils Absolute: 30 cells/uL (ref 15–500)
Eosinophils Relative: 0.4 %
HCT: 40.3 % (ref 35.0–45.0)
Hemoglobin: 13.5 g/dL (ref 11.7–15.5)
Lymphs Abs: 2075 cells/uL (ref 850–3900)
MCH: 32.5 pg (ref 27.0–33.0)
MCHC: 33.5 g/dL (ref 32.0–36.0)
MCV: 96.9 fL (ref 80.0–100.0)
MPV: 12.1 fL (ref 7.5–12.5)
Monocytes Relative: 6.6 %
Neutro Abs: 4963 cells/uL (ref 1500–7800)
Neutrophils Relative %: 65.3 %
Platelets: 283 10*3/uL (ref 140–400)
RBC: 4.16 10*6/uL (ref 3.80–5.10)
RDW: 12.1 % (ref 11.0–15.0)
Total Lymphocyte: 27.3 %
WBC: 7.6 10*3/uL (ref 3.8–10.8)

## 2022-01-13 LAB — COMPLETE METABOLIC PANEL WITH GFR
AG Ratio: 1.5 (calc) (ref 1.0–2.5)
ALT: 14 U/L (ref 6–29)
AST: 14 U/L (ref 10–35)
Albumin: 4.3 g/dL (ref 3.6–5.1)
Alkaline phosphatase (APISO): 59 U/L (ref 31–125)
BUN: 10 mg/dL (ref 7–25)
CO2: 27 mmol/L (ref 20–32)
Calcium: 9.8 mg/dL (ref 8.6–10.2)
Chloride: 104 mmol/L (ref 98–110)
Creat: 0.71 mg/dL (ref 0.50–0.99)
Globulin: 2.8 g/dL (calc) (ref 1.9–3.7)
Glucose, Bld: 93 mg/dL (ref 65–99)
Potassium: 4.8 mmol/L (ref 3.5–5.3)
Sodium: 140 mmol/L (ref 135–146)
Total Bilirubin: 0.6 mg/dL (ref 0.2–1.2)
Total Protein: 7.1 g/dL (ref 6.1–8.1)
eGFR: 105 mL/min/{1.73_m2} (ref >=60)

## 2022-01-13 LAB — LIPID PANEL
Cholesterol: 215 mg/dL — ABNORMAL HIGH (ref <200)
HDL: 74 mg/dL (ref >=50)
LDL Cholesterol (Calc): 121 mg/dL (calc) — ABNORMAL HIGH
Non-HDL Cholesterol (Calc): 141 mg/dL (calc) — ABNORMAL HIGH (ref <130)
Total CHOL/HDL Ratio: 2.9 (calc) (ref <5.0)
Triglycerides: 98 mg/dL (ref <150)

## 2022-01-13 LAB — MICROALBUMIN / CREATININE URINE RATIO
Creatinine, Urine: 64 mg/dL (ref 20–275)
Microalb, Ur: <0.2 mg/dL

## 2022-01-13 LAB — URINALYSIS, ROUTINE W REFLEX MICROSCOPIC
Bilirubin Urine: NEGATIVE
Glucose, UA: NEGATIVE
Hgb urine dipstick: NEGATIVE
Ketones, ur: NEGATIVE
Leukocytes,Ua: NEGATIVE
Nitrite: NEGATIVE
Protein, ur: NEGATIVE
Specific Gravity, Urine: 1.016 (ref 1.001–1.035)
pH: 7.5 (ref 5.0–8.0)

## 2022-01-13 LAB — HEMOGLOBIN A1C
Hgb A1c MFr Bld: 5.4 % of total Hgb (ref <5.7)
Mean Plasma Glucose: 108 mg/dL
eAG (mmol/L): 6 mmol/L

## 2022-01-13 LAB — MAGNESIUM: Magnesium: 2.1 mg/dL (ref 1.5–2.5)

## 2022-01-13 LAB — VITAMIN D 25 HYDROXY (VIT D DEFICIENCY, FRACTURES): Vit D, 25-Hydroxy: 30 ng/mL (ref 30–100)

## 2022-01-13 LAB — TSH: TSH: 1.77 mIU/L

## 2022-01-16 DIAGNOSIS — R002 Palpitations: Secondary | ICD-10-CM

## 2022-01-26 ENCOUNTER — Encounter: Payer: Self-pay | Admitting: Internal Medicine

## 2022-01-26 ENCOUNTER — Encounter: Payer: Self-pay | Admitting: Adult Health

## 2022-01-26 DIAGNOSIS — R87610 Atypical squamous cells of undetermined significance on cytologic smear of cervix (ASC-US): Secondary | ICD-10-CM | POA: Insufficient documentation

## 2022-06-03 IMAGING — CR DG CHEST 2V
2 series · 2 of 2 positions shown · non-contrast
Comparison: 01/12/2021

CLINICAL DATA: History of bilateral calcified granuloma. Former
smoker. No current chest complaints.

EXAM:
CHEST - 2 VIEW

[w chest pa]
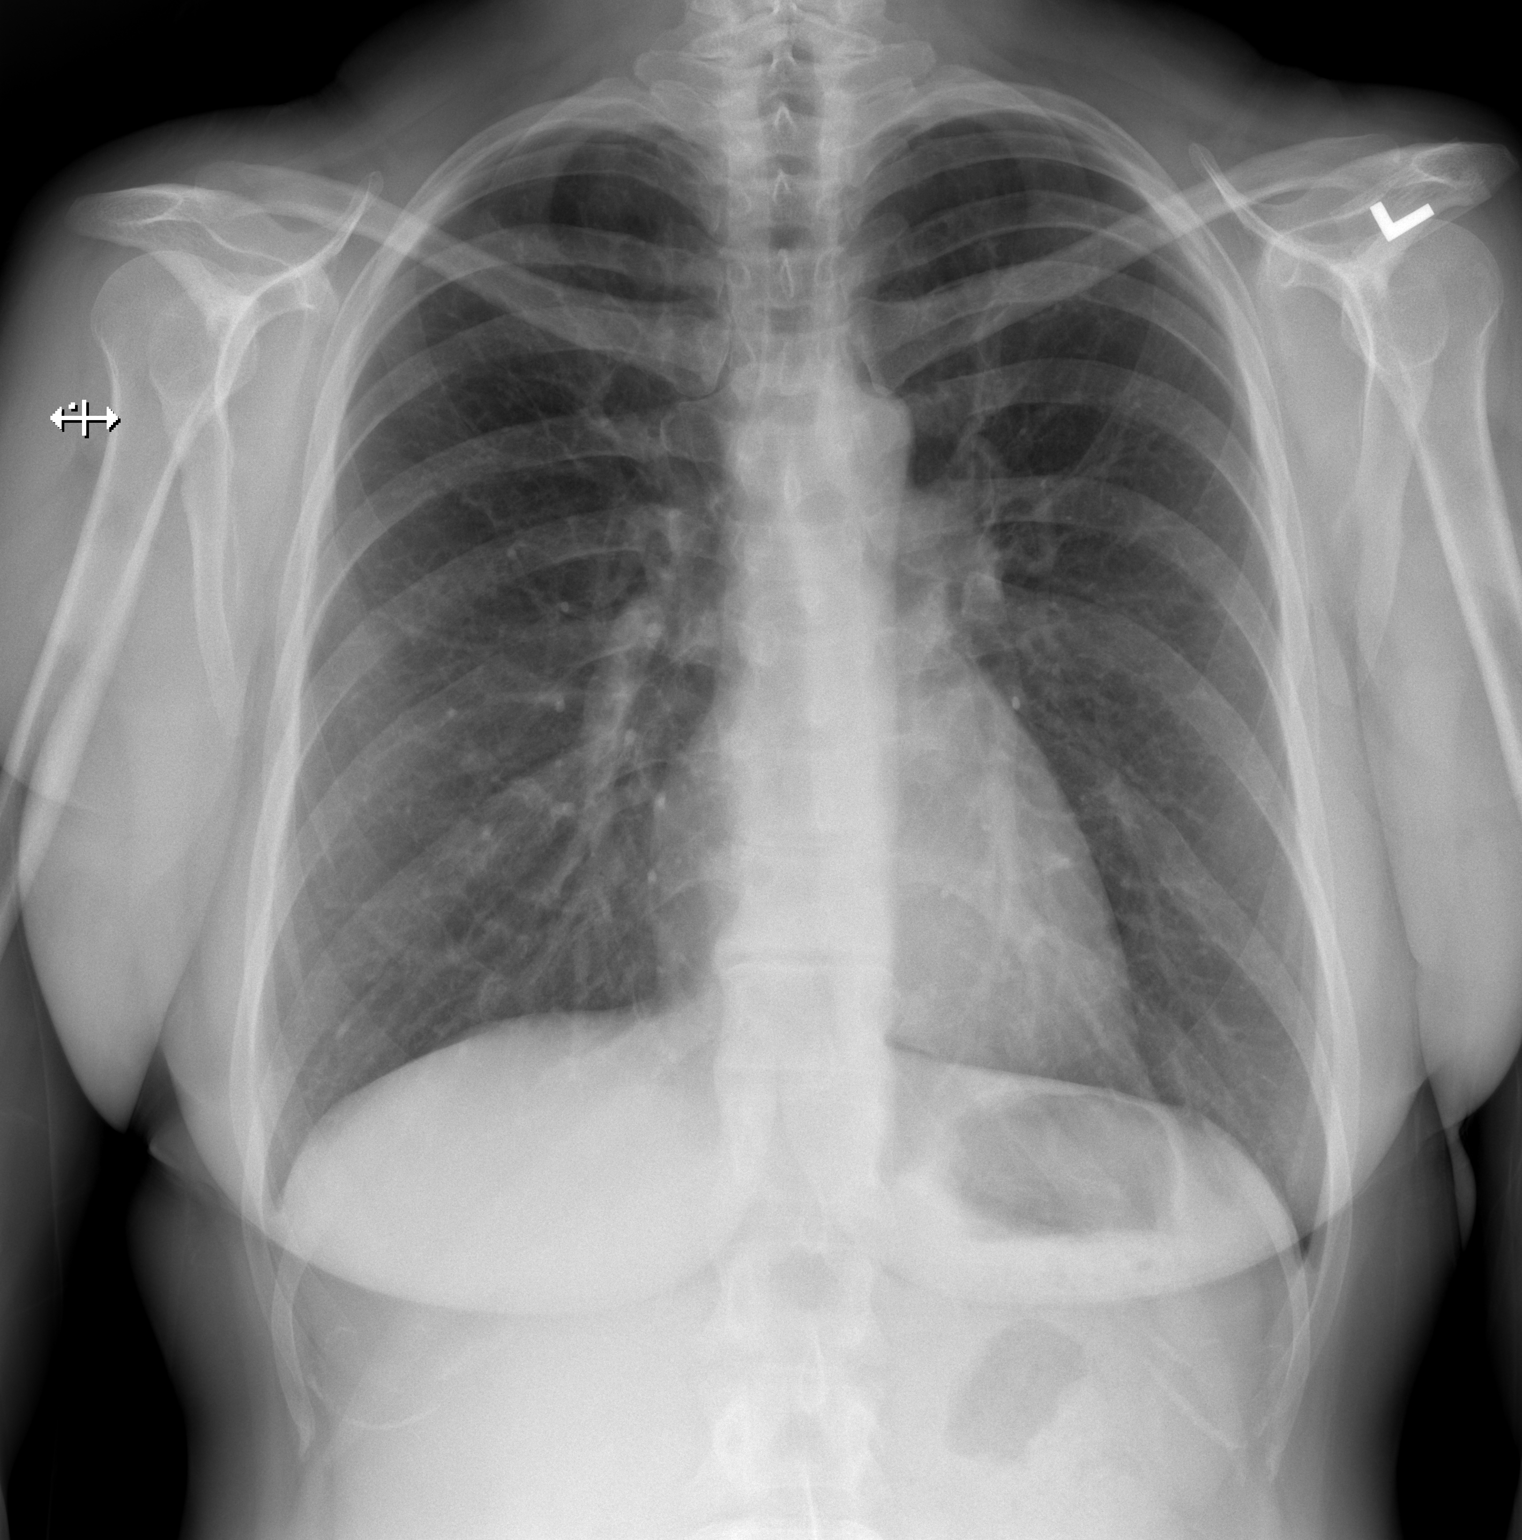

[w chest lat]
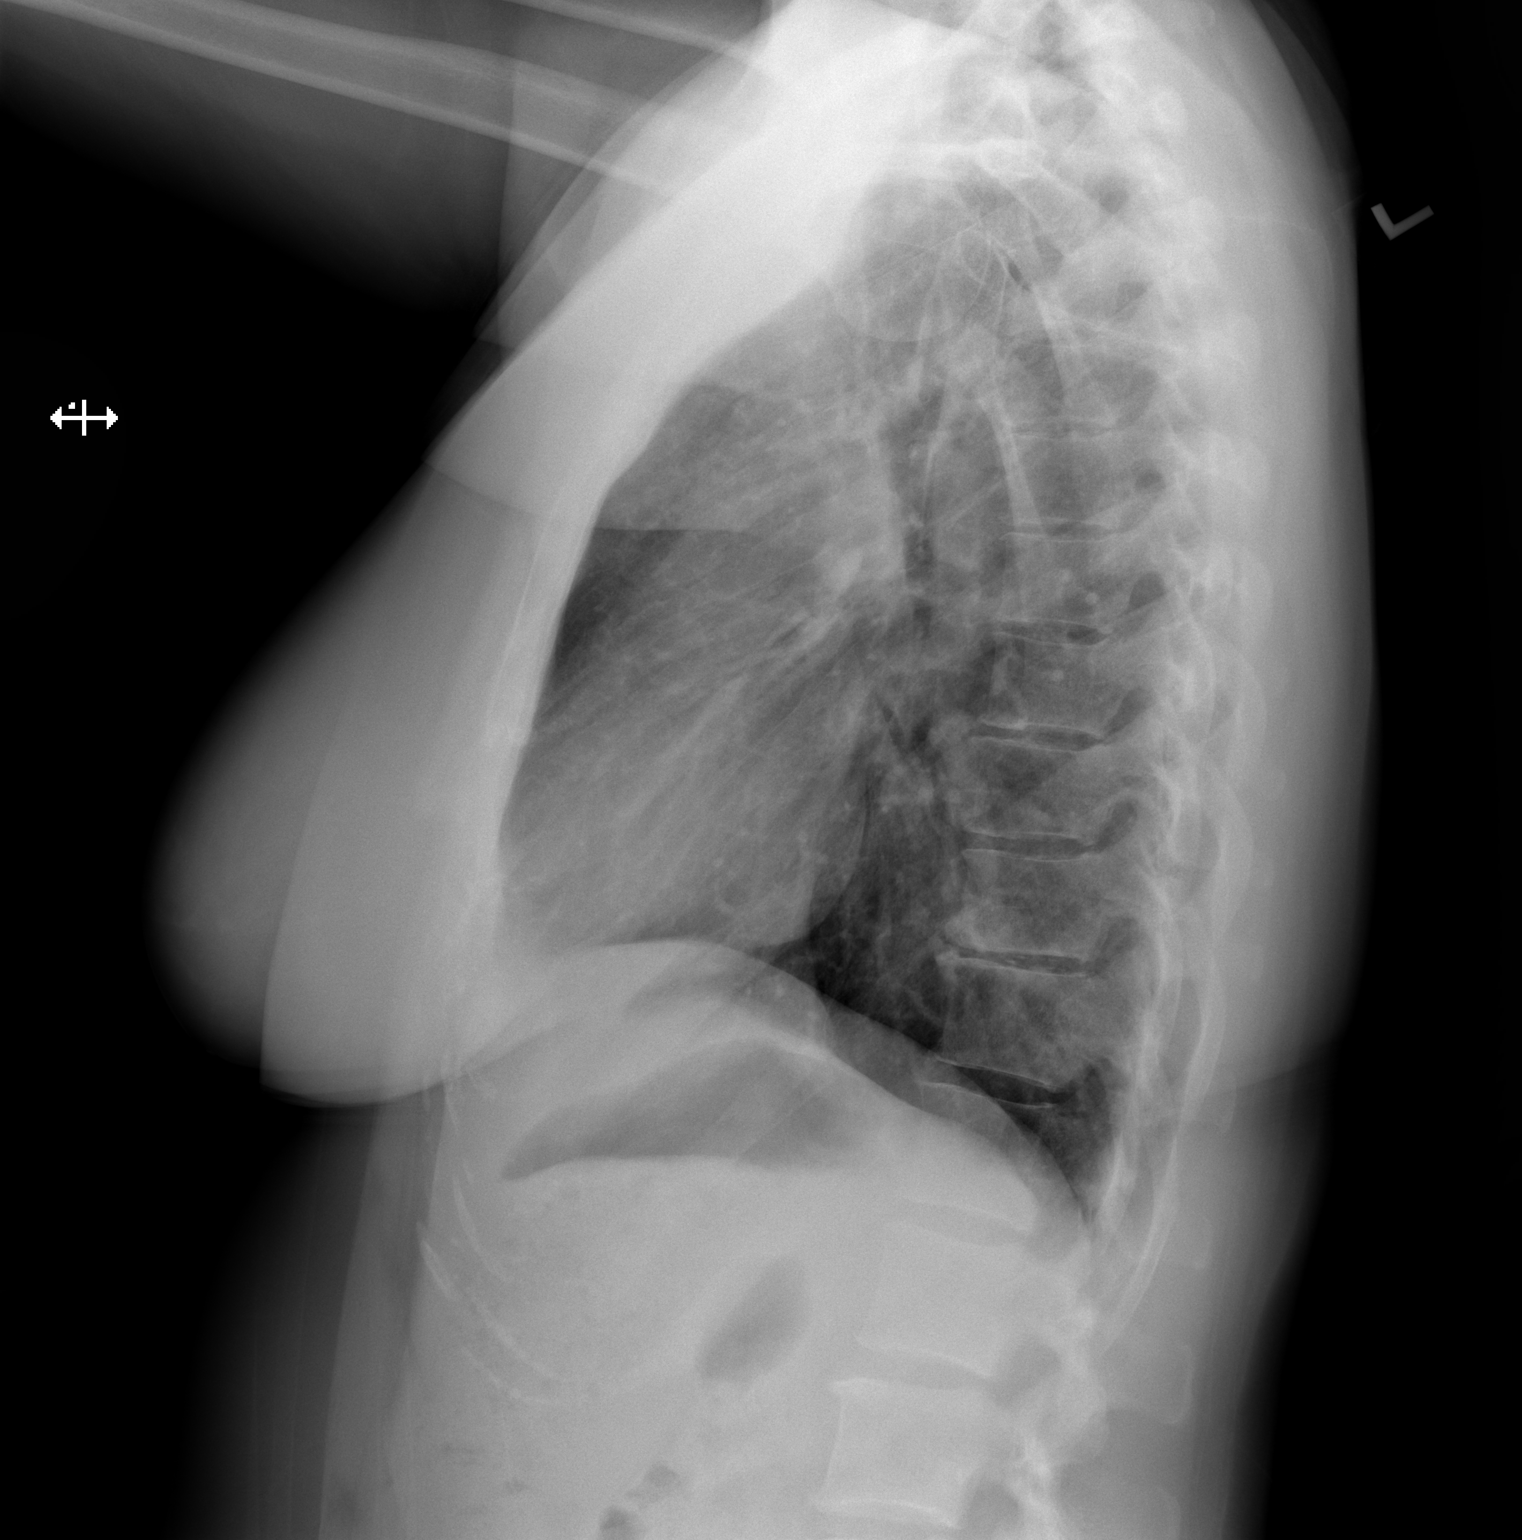

[2 of 2 positions shown; findings below may reference images not displayed]

FINDINGS: Unchanged cardiac silhouette and mediastinal contours. The lungs
remain mildly hyperexpanded with mild diffuse nodular thickening of
the pulmonary interstitium. Scattered punctate bibasilar predominant
granuloma are unchanged compared to the [DATE] examination. No
discrete focal airspace opacities. No pleural effusion or
pneumothorax. No evidence of edema. Stigmata of dish within the
mid/caudal aspect of the thoracic spine.
IMPRESSION: 1. Mild lung hyperexpansion and chronic bronchitic change without
superimposed acute cardiopulmonary disease.
2. Unchanged punctate bilateral granuloma.

## 2023-01-16 ENCOUNTER — Ambulatory Visit: Payer: BLUE CROSS/BLUE SHIELD | Admitting: Nurse Practitioner

## 2023-01-16 ENCOUNTER — Encounter: Payer: Self-pay | Admitting: Nurse Practitioner

## 2023-01-16 VITALS — BP 128/80 | HR 72 | Temp 98.0°F | Resp 16 | Ht 67.0 in | Wt 185.0 lb

## 2023-01-16 DIAGNOSIS — Z1329 Encounter for screening for other suspected endocrine disorder: Secondary | ICD-10-CM

## 2023-01-16 DIAGNOSIS — Z86010 Personal history of colonic polyps: Secondary | ICD-10-CM

## 2023-01-16 DIAGNOSIS — E559 Vitamin D deficiency, unspecified: Secondary | ICD-10-CM

## 2023-01-16 DIAGNOSIS — Z130 Encounter for screening for diseases of the blood and blood-forming organs and certain disorders involving the immune mechanism: Secondary | ICD-10-CM

## 2023-01-16 DIAGNOSIS — I10 Essential (primary) hypertension: Secondary | ICD-10-CM | POA: Diagnosis not present

## 2023-01-16 DIAGNOSIS — Z131 Encounter for screening for diabetes mellitus: Secondary | ICD-10-CM

## 2023-01-16 DIAGNOSIS — K219 Gastro-esophageal reflux disease without esophagitis: Secondary | ICD-10-CM

## 2023-01-16 DIAGNOSIS — E663 Overweight: Secondary | ICD-10-CM

## 2023-01-16 DIAGNOSIS — Z0000 Encounter for general adult medical examination without abnormal findings: Secondary | ICD-10-CM | POA: Diagnosis not present

## 2023-01-16 DIAGNOSIS — R7309 Other abnormal glucose: Secondary | ICD-10-CM

## 2023-01-16 DIAGNOSIS — Z1389 Encounter for screening for other disorder: Secondary | ICD-10-CM

## 2023-01-16 DIAGNOSIS — E782 Mixed hyperlipidemia: Secondary | ICD-10-CM

## 2023-01-16 DIAGNOSIS — Z13220 Encounter for screening for lipoid disorders: Secondary | ICD-10-CM

## 2023-01-16 DIAGNOSIS — G4700 Insomnia, unspecified: Secondary | ICD-10-CM

## 2023-01-16 DIAGNOSIS — H319 Unspecified disorder of choroid: Secondary | ICD-10-CM

## 2023-01-16 DIAGNOSIS — R232 Flushing: Secondary | ICD-10-CM

## 2023-01-16 DIAGNOSIS — Z136 Encounter for screening for cardiovascular disorders: Secondary | ICD-10-CM

## 2023-01-16 DIAGNOSIS — E538 Deficiency of other specified B group vitamins: Secondary | ICD-10-CM

## 2023-01-16 DIAGNOSIS — Z79899 Other long term (current) drug therapy: Secondary | ICD-10-CM

## 2023-01-16 DIAGNOSIS — Z0001 Encounter for general adult medical examination with abnormal findings: Secondary | ICD-10-CM

## 2023-01-16 NOTE — Patient Instructions (Signed)

## 2023-01-16 NOTE — Progress Notes (Signed)
Complete Physical  Assessment and Plan:  Ann Gonzalez was seen today for annual exam.  Diagnoses and all orders for this visit:  Encounter for Annual Physical Exam with abnormal findings Due annually  Health Maintenance reviewed Healthy lifestyle reviewed and goals set  Hypertension Well controlled with lifestyle modifications. Discussed DASH (Dietary Approaches to Stop Hypertension) DASH diet is lower in sodium than a typical American diet. Cut back on foods that are high in saturated fat, cholesterol, and trans fats. Eat more whole-grain foods, fish, poultry, and nuts Remain active and exercise as tolerated daily.  Monitor BP at home-Call if greater than 130/80.  Check CMP/CBC  Gastroesophageal reflux disease, unspecified whether esophagitis present No suspected reflux complications (Barret/stricture). Lifestyle modification:  wt loss, avoid meals 2-3h before bedtime. Consider eliminating food triggers:  chocolate, caffeine, EtOH, acid/spicy food.  Mixed hyperlipidemia Discussed lifestyle modifications. Recommended diet heavy in fruits and veggies, omega 3's. Decrease consumption of animal meats, cheeses, and dairy products. Remain active and exercise as tolerated. Continue to monitor. Check lipids/TSH  Other abnormal glucose/ Screening for DM Education: Reviewed 'ABCs' of diabetes management  Discussed goals to be met and/or maintained include A1C (<7) Blood pressure (<130/80) Cholesterol (LDL <70) Continue Eye Exam yearly  Continue Dental Exam Q6 mo Discussed dietary recommendations Discussed Physical Activity recommendations Check A1C  Overweight (BMI 25.0-29.9) Discussed appropriate BMI Diet modification. Physical activity. Encouraged/praised to build confidence.  Vitamin D deficiency Continue supplement for goal of 60-100 Monitor Vitamin D levels  B12 deficiency Monitor levels  Medication management All medications discussed and reviewed in full. All  questions and concerns regarding medications addressed.    Screening for thyroid disorder -     TSH  Screening for hematuria or proteinuria -     Microalbumin / creatinine urine ratio -     Urinalysis, Routine w reflex microscopic  Screening for cardiovascular condition -     EKG 12-Lead  Disorder of choroid Benign; specialist monitors annually   Personal history of colon polyps Next due 2025 per Dr. Orvan Falconer Discussed low processed/red meat, high fiber diet  Insomnia/ hot flashes Continue gabapentin - effective. Discussed good sleep hygiene. Establish bed and wake times. Sleep restriction-only sleep estimated hrs sleep. Bed only for sex and sleep, only sleep when sleepy, out of bed if anxious (stimulus control). Reviewed relaxation techniques, mindful meditations. Expected sleep duration. Addressed worries about not sleeping.   Palpitations Resolved  EKG normal Continue to monitor  Orders Placed This Encounter  Procedures   CBC with Differential/Platelet   COMPLETE METABOLIC PANEL WITH GFR   Lipid panel   TSH   Hemoglobin A1c   Insulin, random   VITAMIN D 25 Hydroxy (Vit-D Deficiency, Fractures)   Urinalysis, Routine w reflex microscopic   Microalbumin / creatinine urine ratio   Vitamin B12   EKG 12-Lead    Notify office for further evaluation and treatment, questions or concerns if any reported s/s fail to improve.   The patient was advised to call back or seek an in-person evaluation if any symptoms worsen or if the condition fails to improve as anticipated.   Further disposition pending results of labs. Discussed med's effects and SE's.    I discussed the assessment and treatment plan with the patient. The patient was provided an opportunity to ask questions and all were answered. The patient agreed with the plan and demonstrated an understanding of the instructions.  Discussed med's effects and SE's. Screening labs and tests as requested with regular  follow-up  as recommended.  I provided 40 minutes of face-to-face time during this encounter including counseling, chart review, and critical decision making was preformed.  Today's Plan of Care is based on a patient-centered health care approach known as shared decision making - the decisions, tests and treatments allow for patient preferences and values to be balanced with clinical evidence.     Future Appointments  Date Time Provider Department Center  01/16/2024 10:00 AM Adela Glimpse, NP [facility] None     HPI  47 y.o. female  presents for a complete physical and follow up for has GERD (gastroesophageal reflux disease); Vitamin D deficiency; Medication management; Mixed hyperlipidemia; Hypertension; Overweight (BMI 25.0-29.9); B12 deficiency; Left choroidal osteoma; Insomnia; Hot flashes; History of colon polyps; Palpitations; and ASCUS with positive high risk HPV cervical on their problem list.   She is married, 1 child 40 y/o daughter.    She used to  works remotely as region Nurse, adult for education programs has now switched jobs and has some stress of starting a new job.  Also stress with daughter in general (developing teen), feeling overwhelmed at times but manageable.  Feel as though she is in pre-menopausal state causing fluctuating hormones to alter mood.  She can intermittently find herself moody, irritable, sad, crying.  Does not last long.  Overall she is feeling healthy.  ,   [facility] (PAP/mammogram in office, hx of abnormal PAP was recommended repeat this year), had C section at age 25, perimenopausal. She was having insomnia and night time hot flashes, taking gabapentin 200 mg at night. Feels as though this has really helped hot flashses and sleep overall.   CXR in 2022 due to dyspnea dn remote smoking (7.5 pack year, quit 2010) showed calcified granuloma, stable on recheck, saw pulm who concluded benign etiology and wasn't recommended for  further workup.   GERD - recently well controlled by of lifestyle.   BMI is Body mass index is 28.98 kg/m., she has been working on diet and exercise but admits could do better.  Wt Readings from Last 3 Encounters:  01/16/23 185 lb (83.9 kg)  01/12/22 175 lb (79.4 kg)  07/07/21 176 lb (79.8 kg)   Today their BP is BP: 128/80 She does workout. She denies chest pain, shortness of breath, dizziness.   She reports ongoing palpitations that have now resolved, may feel occasionally.  Will have occasionally during the day as well without dyspnea, CP or dizziness. EKG normal.    She is not on cholesterol medication, hx of elevations improved with lifestyle modification. Her cholesterol is at goal. The cholesterol last visit was:   Lab Results  Component Value Date   CHOL 215 (H) 01/12/2022   HDL 74 01/12/2022   LDLCALC 121 (H) 01/12/2022   TRIG 98 01/12/2022   CHOLHDL 2.9 01/12/2022   She has been working on diet and exercise for hx of prediabetes (A1C 5.8% in 2016), denies increased appetite, nausea, paresthesia of the feet, polydipsia and polyuria. Last A1C in the office was:  Lab Results  Component Value Date   HGBA1C 5.4 01/12/2022   Last GFR: Lab Results  Component Value Date   GFRNONAA 100 01/12/2021   Patient is not currently on Vitamin D supplement, inconsistent Lab Results  Component Value Date   VD25OH 30 01/12/2022     B12 deficiency.  Last value:  Lab Results  Component Value Date   VITAMINB12 268 01/12/2021     Current Medications:  Current  Outpatient Medications on File Prior to Visit  Medication Sig Dispense Refill   Calcium Carbonate-Vitamin D (CALCIUM + D PO) Take by mouth. Patient takes calcium 600 mg and Vitamin D10000 iu daily     gabapentin (NEURONTIN) 100 MG capsule TAKE 1 TO 3 CAPSULES BY MOUTH AT BEDTIME FOR SLEEP OR HOT FLASHES 270 capsule 1   Multiple Vitamin (MULTI-VITAMINS) TABS Take by mouth.     valACYclovir (VALTREX) 500 MG tablet TK 1 T PO  D  6   No current facility-administered medications on file prior to visit.   Allergies:  Allergies  Allergen Reactions   Wound Dressing Adhesive    Other Rash    Adhesive   Medical History:  She has GERD (gastroesophageal reflux disease); Vitamin D deficiency; Medication management; Mixed hyperlipidemia; Hypertension; Overweight (BMI 25.0-29.9); B12 deficiency; Left choroidal osteoma; Insomnia; Hot flashes; History of colon polyps; Palpitations; and ASCUS with positive high risk HPV cervical on their problem list.  Health Maintenance:   Immunization History  Administered Date(s) Administered   Influenza-Unspecified 07/01/2020, 05/22/2021   PFIZER Comirnaty(Gray Top)Covid-19 Tri-Sucrose Vaccine 05/22/2021   PFIZER(Purple Top)SARS-COV-2 Vaccination 10/24/2019, 11/18/2019, 07/01/2020   PPD Test 12/09/2015, 03/26/2017, 09/30/2020   Tdap 08/28/2006, 01/26/2013   Unspecified SARS-COV-2 Vaccination 07/01/2020   Health Maintenance  Topic Date Due   PAP SMEAR-Modifier  12/23/2017   COVID-19 Vaccine (6 - 2023-24 season) 04/28/2022   DTaP/Tdap/Td (3 - Td or Tdap) 01/27/2023   INFLUENZA VACCINE  03/29/2023   COLONOSCOPY (Pts 45-38yrs Insurance coverage will need to be confirmed)  03/28/2024   HIV Screening  Completed   HPV VACCINES  Aged Out   Hepatitis C Screening  Discontinued   Tetanus: 2014 due - out of stock Shingrix: age 64 Covid 53: 2/2, pfizer has had booster  LMP: 12/25/22 Pap: goes anually to GYN,  MGM: gets annually at GYN office  DEXA: Start at age 17  Colonoscopy: 03/28/2021, 2 polyps, serrated, 3 year recall per Dr. Orvan Falconer  Last Dental Exam: goes q79m, last 2024, Dr. Ceasar Mons Last Eye Exam: L choroidal osteoma, benign, monitoring only, Dr. Allyne Gee retinal specialist follows annually, also with regular eye doctor annually (Dr. Dan Humphreys) annually  Patient Care Team: Lucky Cowboy, MD as PCP - General (Internal Medicine)  Surgical History:  She has a past surgical  history that includes Wisdom tooth extraction; Septoplasty; Cryoablation; embryo transfer; and Cesarean section (N/A, 09/15/2013). Family History:  Herfamily history includes Cancer in her maternal grandfather; Chronic bronchitis in her mother; Colon cancer in her maternal grandfather; Diabetes in her maternal aunt; Diabetes type II in her mother and sister; Hypertension in her mother; Polymyositis in her father; Rheum arthritis in her mother; Stroke in her maternal grandmother; Tuberculosis in her maternal grandfather. Social History:  She reports that she quit smoking about 14 years ago. Her smoking use included cigarettes. She has a 7.50 pack-year smoking history. She has never used smokeless tobacco. She reports current alcohol use of about 2.0 standard drinks of alcohol per week. She reports that she does not use drugs.  Review of Systems: Review of Systems  Constitutional:  Negative for diaphoresis (improved with gabapentin), malaise/fatigue and weight loss.  HENT:  Negative for hearing loss and tinnitus.   Eyes:  Negative for blurred vision and double vision.  Respiratory:  Negative for cough, shortness of breath and wheezing.   Cardiovascular:  Positive for palpitations (persistent over 1 year, no accompaniments, "fluttering" abrupt). Negative for chest pain, orthopnea, claudication and leg swelling.  Gastrointestinal:  Negative for abdominal pain, blood in stool, constipation, diarrhea, heartburn, melena, nausea and vomiting.  Genitourinary: Negative.   Musculoskeletal:  Negative for joint pain and myalgias.  Skin:  Negative for rash.  Neurological:  Negative for dizziness, tingling, sensory change, weakness and headaches.  Endo/Heme/Allergies:  Negative for polydipsia.  Psychiatric/Behavioral:  Negative for depression, memory loss, substance abuse and suicidal ideas. The patient is not nervous/anxious and does not have insomnia (improved with gabapentin).   All other systems reviewed and  are negative.   Physical Exam: Estimated body mass index is 28.98 kg/m as calculated from the following:   Height as of this encounter: 5\' 7"  (1.702 m).   Weight as of this encounter: 185 lb (83.9 kg). BP 128/80   Pulse 72   Temp 98 F (36.7 C)   Resp 16   Ht 5\' 7"  (1.702 m)   Wt 185 lb (83.9 kg)   SpO2 99%   BMI 28.98 kg/m  General Appearance: Well nourished, in no apparent distress.  Eyes: PERRLA, EOMs, conjunctiva no swelling or erythema Sinuses: No Frontal/maxillary tenderness  ENT/Mouth: Ext aud canals clear, normal light reflex with TMs without erythema, bulging. Good dentition. No erythema, swelling, or exudate on post pharynx. Tonsils not swollen or erythematous. Hearing normal.  Neck: Supple, thyroid normal. No bruits  Respiratory: Respiratory effort normal, BS equal bilaterally without rales, rhonchi, wheezing or stridor.  Cardio: RRR without murmurs, rubs or gallops. Brisk peripheral pulses without edema.  Chest: symmetric, with normal excursions and percussion.  Breasts: Defer to GYN Abdomen: Soft, nontender, no guarding, rebound, hernias, masses, or organomegaly.  Lymphatics: Non tender without lymphadenopathy.  Genitourinary: defer to GYN Musculoskeletal: Full ROM all peripheral extremities,5/5 strength, and normal gait.  Skin: Warm, dry without rashes, lesions, ecchymosis. Neuro: Cranial nerves intact, reflexes equal bilaterally. Normal muscle tone, no cerebellar symptoms. Sensation intact.  Psych: Awake and oriented X 3, normal affect, Insight and Judgment appropriate.   EKG: NSR, NSCPT  Raini Tiley, DNP, AGNP-C 10:15 AM [facility]

## 2023-01-17 LAB — URINALYSIS, ROUTINE W REFLEX MICROSCOPIC
Bilirubin Urine: NEGATIVE
Glucose, UA: NEGATIVE
Hgb urine dipstick: NEGATIVE
Ketones, ur: NEGATIVE
Leukocytes,Ua: NEGATIVE
Nitrite: NEGATIVE
Protein, ur: NEGATIVE
Specific Gravity, Urine: 1.011 (ref 1.001–1.035)
pH: 7.5 (ref 5.0–8.0)

## 2023-01-17 LAB — CBC WITH DIFFERENTIAL/PLATELET
Absolute Monocytes: 392 cells/uL (ref 200–950)
Basophils Absolute: 22 cells/uL (ref 0–200)
Basophils Relative: 0.4 %
Eosinophils Absolute: 39 cells/uL (ref 15–500)
Eosinophils Relative: 0.7 %
HCT: 41.9 % (ref 35.0–45.0)
Hemoglobin: 14 g/dL (ref 11.7–15.5)
Lymphs Abs: 2027 cells/uL (ref 850–3900)
MCH: 32.1 pg (ref 27.0–33.0)
MCHC: 33.4 g/dL (ref 32.0–36.0)
MCV: 96.1 fL (ref 80.0–100.0)
MPV: 12 fL (ref 7.5–12.5)
Monocytes Relative: 7 %
Neutro Abs: 3119 cells/uL (ref 1500–7800)
Neutrophils Relative %: 55.7 %
Platelets: 269 10*3/uL (ref 140–400)
RBC: 4.36 10*6/uL (ref 3.80–5.10)
RDW: 12.2 % (ref 11.0–15.0)
Total Lymphocyte: 36.2 %
WBC: 5.6 10*3/uL (ref 3.8–10.8)

## 2023-01-17 LAB — COMPLETE METABOLIC PANEL WITH GFR
AG Ratio: 1.7 (calc) (ref 1.0–2.5)
ALT: 8 U/L (ref 6–29)
AST: 11 U/L (ref 10–35)
Albumin: 4.5 g/dL (ref 3.6–5.1)
Alkaline phosphatase (APISO): 61 U/L (ref 31–125)
BUN: 9 mg/dL (ref 7–25)
CO2: 26 mmol/L (ref 20–32)
Calcium: 9.9 mg/dL (ref 8.6–10.2)
Chloride: 104 mmol/L (ref 98–110)
Creat: 0.73 mg/dL (ref 0.50–0.99)
Globulin: 2.6 g/dL (calc) (ref 1.9–3.7)
Glucose, Bld: 100 mg/dL — ABNORMAL HIGH (ref 65–99)
Potassium: 4.7 mmol/L (ref 3.5–5.3)
Sodium: 139 mmol/L (ref 135–146)
Total Bilirubin: 0.7 mg/dL (ref 0.2–1.2)
Total Protein: 7.1 g/dL (ref 6.1–8.1)
eGFR: 101 mL/min/{1.73_m2} (ref >=60)

## 2023-01-17 LAB — TSH: TSH: 1.74 mIU/L

## 2023-01-17 LAB — MICROALBUMIN / CREATININE URINE RATIO
Creatinine, Urine: 43 mg/dL (ref 20–275)
Microalb Creat Ratio: 5 mg/g creat (ref <30)
Microalb, Ur: 0.2 mg/dL

## 2023-01-17 LAB — VITAMIN D 25 HYDROXY (VIT D DEFICIENCY, FRACTURES): Vit D, 25-Hydroxy: 18 ng/mL — ABNORMAL LOW (ref 30–100)

## 2023-01-17 LAB — HEMOGLOBIN A1C
Hgb A1c MFr Bld: 5.7 % of total Hgb — ABNORMAL HIGH (ref <5.7)
Mean Plasma Glucose: 117 mg/dL
eAG (mmol/L): 6.5 mmol/L

## 2023-01-17 LAB — INSULIN, RANDOM: Insulin: 8.7 u[IU]/mL

## 2023-01-17 LAB — LIPID PANEL
Cholesterol: 222 mg/dL — ABNORMAL HIGH (ref <200)
HDL: 80 mg/dL (ref >=50)
LDL Cholesterol (Calc): 120 mg/dL (calc) — ABNORMAL HIGH
Non-HDL Cholesterol (Calc): 142 mg/dL (calc) — ABNORMAL HIGH (ref <130)
Total CHOL/HDL Ratio: 2.8 (calc) (ref <5.0)
Triglycerides: 109 mg/dL (ref <150)

## 2023-01-17 LAB — VITAMIN B12: Vitamin B-12: 273 pg/mL (ref 200–1100)

## 2023-03-19 ENCOUNTER — Other Ambulatory Visit: Payer: Self-pay

## 2023-03-19 MED ORDER — GABAPENTIN 100 MG PO CAPS: ORAL_CAPSULE | ORAL | 1 refills | Status: DC

## 2023-09-17 ENCOUNTER — Other Ambulatory Visit: Payer: Self-pay | Admitting: Nurse Practitioner

## 2023-10-05 ENCOUNTER — Ambulatory Visit: Payer: BLUE CROSS/BLUE SHIELD | Admitting: Nurse Practitioner

## 2023-10-09 ENCOUNTER — Ambulatory Visit (INDEPENDENT_AMBULATORY_CARE_PROVIDER_SITE_OTHER): Payer: BLUE CROSS/BLUE SHIELD | Admitting: Family

## 2023-10-09 VITALS — BP 130/82 | HR 75 | Temp 97.6°F | Ht 67.0 in | Wt 193.0 lb

## 2023-10-09 DIAGNOSIS — R232 Flushing: Secondary | ICD-10-CM

## 2023-10-09 DIAGNOSIS — N924 Excessive bleeding in the premenopausal period: Secondary | ICD-10-CM

## 2023-10-09 DIAGNOSIS — R221 Localized swelling, mass and lump, neck: Secondary | ICD-10-CM

## 2023-10-09 NOTE — Progress Notes (Signed)
Acute Office Visit  Subjective:     Patient ID: Ann Gonzalez, female    DOB: Sep 09, 1975, 48 y.o.   MRN: [id number]  Chief Complaint  Patient presents with   Sore Throat    Went to UC last week and was having the front of her neck sore (not like a normal sore throat) and they were able to palpate her thyroid but was unable to get lab work. Would like to check thyroid labs and they mentioned possible ultrasound    HPI Patient is in today as a follow-up from the Urgent Care in [place] last week where she expressed concerns of her throat feeling full. She is a former patient of Dr. Astrid Divine. She reports the provider possibly feeling an abnormality with her thyroid and suggested that she follow-up with her PCP. She feels it may have improved but is concerned about her thyroid. The office was also unable to obtain blood work. She has a family history of hyperthyroidism in her mother.   Also has concerns of heavy vaginal bleeding x 2 weeks. She reports being premenopausal. She had a normal pap smear in August 2024. Also attempted birth control in August to help with hormone levels. She discontinued it after one month because it caused increased cramping and bleeding. Mother had a hysterectomy of uncertain rationale.   Review of Systems  HENT:         Throat fullness  Respiratory: Negative.    Cardiovascular: Negative.   Genitourinary:        Heavy vaginal bleeding x 2 weeks  Musculoskeletal: Negative.   Neurological: Negative.   Endo/Heme/Allergies:        Concerned about possible thyroid abnormality  Psychiatric/Behavioral: Negative.    All other systems reviewed and are negative.  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    Prediabetes 11/25/2014   S/P cesarean section 09/15/2013    Social History   Socioeconomic History   Marital status: Single    Spouse name: Not on file   Number of children: Not on file   Years of education: Not on file   Highest education level: Doctorate  Occupational History   Not on file  Tobacco Use   Smoking status: Former    Current packs/day: 0.00    Average packs/day: 0.8 packs/day for 10.0 years (7.5 ttl pk-yrs)    Types: Cigarettes    Start date: 09/12/1998    Quit date: 09/12/2008    Years since quitting: 15.0   Smokeless tobacco: Never  Vaping Use   Vaping status: Never Used  Substance and Sexual Activity   Alcohol use: Yes    Alcohol/week: 2.0 standard drinks of alcohol    Types: 2 Glasses of wine per week   Drug use: No   Sexual activity: Yes    Partners: Male    Birth control/protection: None  Other Topics Concern   Not on file  Social History Narrative   Not on file   Social Drivers of Health   Financial Resource Strain: Low Risk  (10/08/2023)   Overall Financial Resource Strain (CARDIA)    Difficulty of Paying Living Expenses:  Not hard at all  Food Insecurity: No Food Insecurity (10/08/2023)   Hunger Vital Sign    Worried About Running Out of Food in the Last Year: Never true    Ran Out of Food in the Last Year: Never true  Transportation Needs: No Transportation Needs (10/08/2023)   PRAPARE - Administrator, Civil Service (Medical): No    Lack of Transportation (Non-Medical): No  Physical Activity: Insufficiently Active (10/08/2023)   Exercise Vital Sign    Days of Exercise per Week: 1 day    Minutes of Exercise per Session: 20 min  Stress: Stress Concern Present (10/08/2023)   [employer] of Occupational Health - Occupational Stress Questionnaire    Feeling of Stress : To some extent  Social Connections: Socially Integrated (10/08/2023)   Social Connection and  Isolation Panel [NHANES]    Frequency of Communication with Friends and Family: More than three times a week    Frequency of Social Gatherings with Friends and Family: Three times a week    Attends Religious Services: More than 4 times per year    Active Member of Clubs or Organizations: Yes    Attends Banker Meetings: More than 4 times per year    Marital Status: Married  Catering manager Violence: Unknown (12/02/2021)   Received from [employer], [facility]   HITS    Physically Hurt: Not on file    Insult or Talk Down To: Not on file    Threaten Physical Harm: Not on file    Scream or Curse: Not on file    Past Surgical History:  Procedure Laterality Date   CESAREAN SECTION N/A 09/15/2013   Procedure: CESAREAN SECTION;  Surgeon: Zelphia Cairo, MD;  Location: [facility];  Service: Obstetrics;  Laterality: N/A;  PRIMARY EDC 1/20   CRYOABLATION     embryo transfer     SEPTOPLASTY     WISDOM TOOTH EXTRACTION      Family History  Problem Relation Age of Onset   Rheum arthritis Mother        9s   Diabetes type II Mother    Hypertension Mother    Chronic bronchitis Mother    Polymyositis Father        77s   Diabetes type II Sister    Stroke Maternal Grandmother    Colon cancer Maternal Grandfather        70's   Cancer Maternal Grandfather    Tuberculosis Maternal Grandfather    Diabetes Maternal Aunt    Esophageal cancer Neg Hx    Stomach cancer Neg Hx    Rectal cancer Neg Hx    Colon polyps Neg Hx     Allergies  Allergen Reactions   Wound Dressing Adhesive    Other Rash    Adhesive    Current Outpatient Medications on File Prior to Visit  Medication Sig Dispense Refill   Calcium Carbonate-Vitamin D (CALCIUM + D PO) Take by mouth. Patient takes calcium 600 mg and Vitamin D10000 iu daily     gabapentin (NEURONTIN) 100 MG capsule TAKE 1 TO 3 CAPSULES BY MOUTH AT BEDTIME FOR SLEEP OR HOT FLASHES 270 capsule 0   Multiple Vitamin (MULTI-VITAMINS) TABS  Take by mouth.     valACYclovir (VALTREX) 500 MG tablet TK 1 T PO D  6   No current facility-administered medications on file prior to visit.    BP 130/82 (BP Location: Left Arm, Patient Position: Sitting)   Pulse 75  Temp 97.6 F (36.4 C) (Temporal)   Ht 5\' 7"  (1.702 m)   Wt 193 lb (87.5 kg)   SpO2 98%   BMI 30.23 kg/m chart     Objective:    BP 130/82 (BP Location: Left Arm, Patient Position: Sitting)   Pulse 75   Temp 97.6 F (36.4 C) (Temporal)   Ht 5\' 7"  (1.702 m)   Wt 193 lb (87.5 kg)   SpO2 98%   BMI 30.23 kg/m    Physical Exam Vitals reviewed.  Constitutional:      Appearance: She is well-developed and normal weight.  HENT:     Mouth/Throat:     Pharynx: Oropharynx is clear. No pharyngeal swelling, posterior oropharyngeal erythema or uvula swelling.  Neck:     Thyroid: No thyromegaly.  Cardiovascular:     Rate and Rhythm: Normal rate and regular rhythm.     Heart sounds: Normal heart sounds.  Pulmonary:     Effort: Pulmonary effort is normal.     Breath sounds: Normal breath sounds.  Abdominal:     General: Bowel sounds are normal.     Palpations: Abdomen is soft.  Musculoskeletal:     Cervical back: Normal range of motion and neck supple.  Skin:    General: Skin is warm and dry.  Neurological:     General: No focal deficit present.     Mental Status: She is alert and oriented to person, place, and time.  Psychiatric:        Mood and Affect: Mood normal.        Behavior: Behavior normal.     No results found for any visits on 10/09/23.      Assessment & Plan:   Problem List Items Addressed This Visit     Hot flashes - Primary   Relevant Orders   TSH   CBC w/Diff   Comprehensive metabolic panel   US THYROID   Other Visit Diagnoses       Neck fullness       Relevant Orders   TSH   CBC w/Diff   Comprehensive metabolic panel   US THYROID     Abnormal perimenopausal bleeding       Relevant Orders   TSH   CBC w/Diff    Comprehensive metabolic panel   US THYROID       No orders of the defined types were placed in this encounter.  If TSH is normal, will order a transvaginal ultrasound to determine the cause for vaginal bleeding. Thyroid US was ordered today and labs. Will follow-up pending results. List of providers provided for patient today to select a new PCP.  No follow-ups on file.  Eulis Foster, FNP

## 2023-10-10 ENCOUNTER — Other Ambulatory Visit: Payer: Self-pay | Admitting: Family

## 2023-10-10 ENCOUNTER — Encounter: Payer: Self-pay | Admitting: Family

## 2023-10-10 DIAGNOSIS — N939 Abnormal uterine and vaginal bleeding, unspecified: Secondary | ICD-10-CM

## 2023-10-10 LAB — TSH: TSH: 1.67 u[IU]/mL (ref 0.35–5.50)

## 2023-10-10 LAB — CBC WITH DIFFERENTIAL/PLATELET
Basophils Absolute: 0 10*3/uL (ref 0.0–0.1)
Basophils Relative: 0.5 % (ref 0.0–3.0)
Eosinophils Absolute: 0.1 10*3/uL (ref 0.0–0.7)
Eosinophils Relative: 0.6 % (ref 0.0–5.0)
HCT: 39.9 % (ref 36.0–46.0)
Hemoglobin: 13.4 g/dL (ref 12.0–15.0)
Lymphocytes Relative: 19.7 % (ref 12.0–46.0)
Lymphs Abs: 1.6 10*3/uL (ref 0.7–4.0)
MCHC: 33.6 g/dL (ref 30.0–36.0)
MCV: 99 fL (ref 78.0–100.0)
Monocytes Absolute: 0.5 10*3/uL (ref 0.1–1.0)
Monocytes Relative: 6.5 % (ref 3.0–12.0)
Neutro Abs: 5.8 10*3/uL (ref 1.4–7.7)
Neutrophils Relative %: 72.7 % (ref 43.0–77.0)
Platelets: 282 10*3/uL (ref 150.0–400.0)
RBC: 4.03 Mil/uL (ref 3.87–5.11)
RDW: 13.1 % (ref 11.5–15.5)
WBC: 8 10*3/uL (ref 4.0–10.5)

## 2023-10-10 LAB — COMPREHENSIVE METABOLIC PANEL
ALT: 16 U/L (ref 0–35)
AST: 19 U/L (ref 0–37)
Albumin: 4.3 g/dL (ref 3.5–5.2)
Alkaline Phosphatase: 61 U/L (ref 39–117)
BUN: 10 mg/dL (ref 6–23)
CO2: 28 meq/L (ref 19–32)
Calcium: 9.2 mg/dL (ref 8.4–10.5)
Chloride: 104 meq/L (ref 96–112)
Creatinine, Ser: 0.69 mg/dL (ref 0.40–1.20)
GFR: 101.88 mL/min (ref >60.00)
Glucose, Bld: 135 mg/dL — ABNORMAL HIGH (ref 70–99)
Potassium: 4.2 meq/L (ref 3.5–5.1)
Sodium: 140 meq/L (ref 135–145)
Total Bilirubin: 0.4 mg/dL (ref 0.2–1.2)
Total Protein: 7.2 g/dL (ref 6.0–8.3)

## 2023-10-12 NOTE — Progress Notes (Signed)
Patient made aware of result and scheduled for ultrasound on 10/15/23.

## 2023-10-15 ENCOUNTER — Ambulatory Visit
Admission: RE | Admit: 2023-10-15 | Discharge: 2023-10-15 | Disposition: A | Discharge status: Home / Self Care | Payer: BLUE CROSS/BLUE SHIELD | Source: Ambulatory Visit | Attending: Family | Admitting: Family

## 2023-10-15 DIAGNOSIS — N924 Excessive bleeding in the premenopausal period: Secondary | ICD-10-CM

## 2023-10-15 DIAGNOSIS — R221 Localized swelling, mass and lump, neck: Secondary | ICD-10-CM

## 2023-10-15 DIAGNOSIS — R232 Flushing: Secondary | ICD-10-CM

## 2023-10-15 DIAGNOSIS — N939 Abnormal uterine and vaginal bleeding, unspecified: Secondary | ICD-10-CM

## 2023-10-16 ENCOUNTER — Encounter: Payer: Self-pay | Admitting: Family

## 2023-10-19 ENCOUNTER — Encounter: Payer: Self-pay | Admitting: Family

## 2023-11-19 ENCOUNTER — Telehealth: Payer: Self-pay | Admitting: Internal Medicine

## 2023-11-19 ENCOUNTER — Other Ambulatory Visit: Payer: Self-pay | Admitting: Family

## 2023-11-19 MED ORDER — GABAPENTIN 100 MG PO CAPS: ORAL_CAPSULE | ORAL | 1 refills | Status: DC

## 2023-11-19 NOTE — Telephone Encounter (Unsigned)
 Copied from CRM [id number]. Topic: Clinical - Medication Refill >> Nov 19, 2023  9:31 AM Theodis Sato wrote: Most Recent Primary Care Visit:  Provider: Worthy Rancher B  Department: [facility]  Visit Type: OFFICE VISIT  Date: 10/09/2023  Medication: gabapentin (NEURONTIN) 100 MG capsule  Has the patient contacted their pharmacy? No, out of refills. Patient has a TOC appointment with Sherrlyn Hock on 6/6 but is almost out of this medication. Patient saw [employer] in February is wondering if she will fill this medication for her.   (Agent: If no, request that the patient contact the pharmacy for the refill. If patient does not wish to contact the pharmacy document the reason why and proceed with request.) (Agent: If yes, when and what did the pharmacy advise?)  Is this the correct pharmacy for this prescription? Yes If no, delete pharmacy and type the correct one.  This is the patient's preferred pharmacy:  [id number] Rosalita Levan,  - [address] AT [facility] [address] [place] [address] Phone: [phone] Fax: [phone]   Has the prescription been filled recently? Yes  Is the patient out of the medication? No  Has the patient been seen for an appointment in the last year OR does the patient have an upcoming appointment? Yes  Can we respond through MyChart? Yes  Agent: Please be advised that Rx refills may take up to 3 business days. We ask that you follow-up with your pharmacy.

## 2024-01-16 ENCOUNTER — Encounter: Payer: BLUE CROSS/BLUE SHIELD | Admitting: Nurse Practitioner

## 2024-01-31 NOTE — Progress Notes (Unsigned)
 New Patient Office Visit  Subjective    Patient ID: Ann Gonzalez, female    DOB: Jun 07, 1976  Age: 48 y.o. MRN: [id number]  CC: No chief complaint on file.   HPI Ann Gonzalez presents to establish care today. Up to date on routine vaccines. Up to date on routine screenings.  Receives regular dental and eye care.  Reports eating well, sleeping well, feeling well overall.  Reports compliance with medication regimen.  Denies other concerns today.  Outpatient Encounter Medications as of 02/01/2024  Medication Sig  . Calcium Carbonate-Vitamin D  (CALCIUM + D PO) Take by mouth. Patient takes calcium 600 mg and Vitamin D10000 iu daily  . gabapentin  (NEURONTIN ) 100 MG capsule TAKE 1 TO 3 CAPSULES BY MOUTH AT BEDTIME FOR SLEEP OR HOT FLASHES  . Multiple Vitamin (MULTI-VITAMINS) TABS Take by mouth.  . valACYclovir (VALTREX) 500 MG tablet TK 1 T PO D   No facility-administered encounter medications on file as of 02/01/2024.    Past Medical History:  Diagnosis Date  . 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   . Costochondritis 11/25/2014  . Dysplasia of cervix   . GERD (gastroesophageal reflux disease)    during pregnancy  . Headache(784.0)    migraines as teenager  . HSV (herpes simplex virus) infection   . Newborn product of IVF pregnancy   . Prediabetes 11/25/2014  . S/P cesarean section 09/15/2013    Past Surgical History:  Procedure Laterality Date  . CESAREAN SECTION N/A 09/15/2013   Procedure: CESAREAN SECTION;  Surgeon: Ashby Lawman, MD;  Location: [facility];  Service: Obstetrics;  Laterality: N/A;  PRIMARY EDC 1/20  . CRYOABLATION    . embryo transfer    . SEPTOPLASTY    . WISDOM TOOTH EXTRACTION      Family History  Problem Relation Age of Onset  . Rheum arthritis Mother        6s  . Diabetes type II Mother   . Hypertension Mother   . Chronic bronchitis Mother   . Polymyositis Father        53s  . Diabetes  type II Sister   . Stroke Maternal Grandmother   . Colon cancer Maternal Grandfather        70's  . Cancer Maternal Grandfather   . Tuberculosis Maternal Grandfather   . Diabetes Maternal Aunt   . Esophageal cancer Neg Hx   . Stomach cancer Neg Hx   . Rectal cancer Neg Hx   . Colon polyps Neg Hx     Social History   Socioeconomic History  . Marital status: Single    Spouse name: Not on file  . Number of children: Not on file  . Years of education: Not on file  . Highest education level: Doctorate  Occupational History  . Not on file  Tobacco Use  . Smoking status: Former    Current packs/day: 0.00    Average packs/day: 0.8 packs/day for 10.0 years (7.5 ttl pk-yrs)    Types: Cigarettes    Start date: 09/12/1998    Quit date: 09/12/2008    Years since quitting: 15.3  . Smokeless tobacco: Never  Vaping Use  . Vaping status: Never Used  Substance and Sexual Activity  . Alcohol use: Yes    Alcohol/week: 2.0 standard drinks of alcohol    Types: 2 Glasses of wine per week  . Drug use: No  . Sexual activity: Yes  Partners: Male    Birth control/protection: None  Other Topics Concern  . Not on file  Social History Narrative  . Not on file   Social Drivers of Health   Financial Resource Strain: Low Risk  (10/08/2023)   Overall Financial Resource Strain (CARDIA)   . Difficulty of Paying Living Expenses: Not hard at all  Food Insecurity: No Food Insecurity (10/08/2023)   Hunger Vital Sign   . Worried About Programme researcher, broadcasting/film/video in the Last Year: Never true   . Ran Out of Food in the Last Year: Never true  Transportation Needs: No Transportation Needs (10/08/2023)   PRAPARE - Transportation   . Lack of Transportation (Medical): No   . Lack of Transportation (Non-Medical): No  Physical Activity: Insufficiently Active (10/08/2023)   Exercise Vital Sign   . Days of Exercise per Week: 1 day   . Minutes of Exercise per Session: 20 min  Stress: Stress Concern Present  (10/08/2023)   [employer] of Occupational Health - Occupational Stress Questionnaire   . Feeling of Stress : To some extent  Social Connections: Socially Integrated (10/08/2023)   Social Connection and Isolation Panel [NHANES]   . Frequency of Communication with Friends and Family: More than three times a week   . Frequency of Social Gatherings with Friends and Family: Three times a week   . Attends Religious Services: More than 4 times per year   . Active Member of Clubs or Organizations: Yes   . Attends Banker Meetings: More than 4 times per year   . Marital Status: Married  Catering manager Violence: Unknown (12/02/2021)   Received from [facility], [facility]   HITS   . Physically Hurt: Not on file   . Insult or Talk Down To: Not on file   . Threaten Physical Harm: Not on file   . Scream or Curse: Not on file    ROS Per HPI      Objective    There were no vitals taken for this visit.  Physical Exam Vitals and nursing note reviewed.  Constitutional:      General: She is not in acute distress.    Appearance: Normal appearance. She is normal weight.  HENT:     Head: Normocephalic and atraumatic.     Right Ear: External ear normal.     Left Ear: External ear normal.     Nose: Nose normal.     Mouth/Throat:     Mouth: Mucous membranes are moist.     Pharynx: Oropharynx is clear.  Eyes:     Extraocular Movements: Extraocular movements intact.     Pupils: Pupils are equal, round, and reactive to light.  Cardiovascular:     Rate and Rhythm: Normal rate and regular rhythm.     Pulses: Normal pulses.     Heart sounds: Normal heart sounds.  Pulmonary:     Effort: Pulmonary effort is normal. No respiratory distress.     Breath sounds: Normal breath sounds. No wheezing, rhonchi or rales.  Musculoskeletal:        General: Normal range of motion.     Cervical back: Normal range of motion.     Right lower leg: No edema.     Left lower leg: No edema.   Lymphadenopathy:     Cervical: No cervical adenopathy.  Neurological:     General: No focal deficit present.     Mental Status: She is alert and oriented to person, place,  and time.  Psychiatric:        Mood and Affect: Mood normal.        Thought Content: Thought content normal.       Assessment & Plan:   Primary hypertension  B12 deficiency  Mixed hyperlipidemia  Vitamin D  deficiency  Insomnia, unspecified type     No follow-ups on file.   Wellington Half, FNP

## 2024-02-01 ENCOUNTER — Ambulatory Visit: Admitting: Family Medicine

## 2024-02-01 ENCOUNTER — Encounter: Payer: Self-pay | Admitting: Family Medicine

## 2024-02-01 ENCOUNTER — Other Ambulatory Visit

## 2024-02-01 VITALS — BP 130/80 | HR 90 | Temp 98.6°F | Wt 192.0 lb

## 2024-02-01 DIAGNOSIS — L308 Other specified dermatitis: Secondary | ICD-10-CM

## 2024-02-01 DIAGNOSIS — R232 Flushing: Secondary | ICD-10-CM | POA: Diagnosis not present

## 2024-02-01 DIAGNOSIS — E559 Vitamin D deficiency, unspecified: Secondary | ICD-10-CM | POA: Diagnosis not present

## 2024-02-01 DIAGNOSIS — Z79899 Other long term (current) drug therapy: Secondary | ICD-10-CM | POA: Diagnosis not present

## 2024-02-01 DIAGNOSIS — I10 Essential (primary) hypertension: Secondary | ICD-10-CM | POA: Diagnosis not present

## 2024-02-01 DIAGNOSIS — E782 Mixed hyperlipidemia: Secondary | ICD-10-CM

## 2024-02-01 DIAGNOSIS — Z0000 Encounter for general adult medical examination without abnormal findings: Secondary | ICD-10-CM

## 2024-02-01 DIAGNOSIS — G4700 Insomnia, unspecified: Secondary | ICD-10-CM

## 2024-02-01 DIAGNOSIS — E538 Deficiency of other specified B group vitamins: Secondary | ICD-10-CM

## 2024-02-01 DIAGNOSIS — B009 Herpesviral infection, unspecified: Secondary | ICD-10-CM | POA: Diagnosis not present

## 2024-02-01 DIAGNOSIS — R739 Hyperglycemia, unspecified: Secondary | ICD-10-CM

## 2024-02-01 DIAGNOSIS — Z1211 Encounter for screening for malignant neoplasm of colon: Secondary | ICD-10-CM

## 2024-02-01 LAB — HEMOGLOBIN A1C: Hgb A1c MFr Bld: 5.6 % (ref 4.6–6.5)

## 2024-02-01 LAB — COMPREHENSIVE METABOLIC PANEL WITH GFR
ALT: 19 U/L (ref 0–35)
AST: 22 U/L (ref 0–37)
Albumin: 4.6 g/dL (ref 3.5–5.2)
Alkaline Phosphatase: 61 U/L (ref 39–117)
BUN: 10 mg/dL (ref 6–23)
CO2: 31 meq/L (ref 19–32)
Calcium: 9.8 mg/dL (ref 8.4–10.5)
Chloride: 102 meq/L (ref 96–112)
Creatinine, Ser: 0.96 mg/dL (ref 0.40–1.20)
GFR: 69.35 mL/min (ref >60.00)
Glucose, Bld: 117 mg/dL — ABNORMAL HIGH (ref 70–99)
Potassium: 4.4 meq/L (ref 3.5–5.1)
Sodium: 139 meq/L (ref 135–145)
Total Bilirubin: 0.6 mg/dL (ref 0.2–1.2)
Total Protein: 7.6 g/dL (ref 6.0–8.3)

## 2024-02-01 LAB — LIPID PANEL
Cholesterol: 234 mg/dL — ABNORMAL HIGH (ref 0–200)
HDL: 69.5 mg/dL (ref >39.00)
LDL Cholesterol: 121 mg/dL — ABNORMAL HIGH (ref 0–99)
NonHDL: 164.68
Total CHOL/HDL Ratio: 3
Triglycerides: 219 mg/dL — ABNORMAL HIGH (ref 0.0–149.0)
VLDL: 43.8 mg/dL — ABNORMAL HIGH (ref 0.0–40.0)

## 2024-02-01 LAB — CBC WITH DIFFERENTIAL/PLATELET
Basophils Absolute: 0.1 10*3/uL (ref 0.0–0.1)
Basophils Relative: 0.7 % (ref 0.0–3.0)
Eosinophils Absolute: 0.1 10*3/uL (ref 0.0–0.7)
Eosinophils Relative: 0.7 % (ref 0.0–5.0)
HCT: 42 % (ref 36.0–46.0)
Hemoglobin: 14.3 g/dL (ref 12.0–15.0)
Lymphocytes Relative: 22.3 % (ref 12.0–46.0)
Lymphs Abs: 1.7 10*3/uL (ref 0.7–4.0)
MCHC: 34 g/dL (ref 30.0–36.0)
MCV: 96.7 fl (ref 78.0–100.0)
Monocytes Absolute: 0.6 10*3/uL (ref 0.1–1.0)
Monocytes Relative: 8.5 % (ref 3.0–12.0)
Neutro Abs: 5.2 10*3/uL (ref 1.4–7.7)
Neutrophils Relative %: 67.8 % (ref 43.0–77.0)
Platelets: 257 10*3/uL (ref 150.0–400.0)
RBC: 4.34 Mil/uL (ref 3.87–5.11)
RDW: 12.7 % (ref 11.5–15.5)
WBC: 7.6 10*3/uL (ref 4.0–10.5)

## 2024-02-01 MED ORDER — GABAPENTIN 100 MG PO CAPS: ORAL_CAPSULE | ORAL | 1 refills | Status: DC

## 2024-02-01 MED ORDER — TRIAMCINOLONE ACETONIDE 0.1 % EX CREA: 1.0000 | TOPICAL_CREAM | Freq: Two times a day (BID) | CUTANEOUS | 0 refills | Status: AC

## 2024-02-01 MED ORDER — VALACYCLOVIR HCL 500 MG PO TABS: 500.0000 mg | ORAL_TABLET | Freq: Every day | ORAL | 6 refills | Status: AC

## 2024-02-01 NOTE — Patient Instructions (Addendum)
 Moisture wicking silver material for clothing  [address], [facility] building  Welcome to [facility]!  Thank you for choosing us  for your Primary Care needs.   We offer in person and video appointments for your convenience. You may call our office to schedule appointments, or you may schedule appointments with me through MyChart.   The best way to get in contact with me is via MyChart message. This will get to me faster than a phone call, unless there is an emergency, then please call 911.  The lab is located downstairs in the Sports Medicine building, we also have xray available there.   I have sent in triamcinolone cream for you. You may use this twice a day.

## 2024-02-07 ENCOUNTER — Telehealth: Payer: Self-pay | Admitting: Family Medicine

## 2024-02-07 LAB — VITAMIN B12: Vitamin B-12: 176 pg/mL — ABNORMAL LOW (ref 211–911)

## 2024-02-07 LAB — VITAMIN D 25 HYDROXY (VIT D DEFICIENCY, FRACTURES): VITD: 27.35 ng/mL — ABNORMAL LOW (ref 30.00–100.00)

## 2024-02-07 NOTE — Telephone Encounter (Signed)
 Copied from CRM [id number]. Topic: General - Other >> Feb 07, 2024  2:28 PM Howard Macho wrote: Reason for VHQ:[id number] called stating she want a copy of her  transvaginal ultrasound that was done on 2/17 sent to her obgyn doctor MD Neila B[facility] at [facility]. Patient also want to know about her recent lab results  Fax [phone] ([facility]) CB [phone] (patient)

## 2024-02-08 ENCOUNTER — Ambulatory Visit: Payer: Self-pay | Admitting: Family Medicine

## 2024-02-08 DIAGNOSIS — E559 Vitamin D deficiency, unspecified: Secondary | ICD-10-CM

## 2024-02-08 MED ORDER — VITAMIN D (ERGOCALCIFEROL) 1.25 MG (50000 UNIT) PO CAPS: 50000.0000 [IU] | ORAL_CAPSULE | ORAL | 0 refills | Status: DC

## 2024-02-08 NOTE — Telephone Encounter (Signed)
 Patient viewed providers comments via MyChart, attempted to reach patient to confirm. US  Faxed as requested

## 2024-04-04 ENCOUNTER — Encounter: Payer: Self-pay | Admitting: Internal Medicine

## 2024-04-07 ENCOUNTER — Other Ambulatory Visit: Payer: Self-pay | Admitting: Family Medicine

## 2024-04-07 DIAGNOSIS — E559 Vitamin D deficiency, unspecified: Secondary | ICD-10-CM

## 2024-04-22 ENCOUNTER — Encounter

## 2024-05-07 ENCOUNTER — Encounter: Admitting: Internal Medicine

## 2024-05-29 ENCOUNTER — Encounter: Payer: Self-pay | Admitting: Pediatrics

## 2024-05-30 ENCOUNTER — Ambulatory Visit: Admitting: Family Medicine

## 2024-05-30 ENCOUNTER — Encounter: Payer: Self-pay | Admitting: Family Medicine

## 2024-05-30 VITALS — BP 118/84 | HR 76 | Temp 98.7°F | Ht 67.0 in | Wt 195.4 lb

## 2024-05-30 DIAGNOSIS — E559 Vitamin D deficiency, unspecified: Secondary | ICD-10-CM

## 2024-05-30 DIAGNOSIS — E538 Deficiency of other specified B group vitamins: Secondary | ICD-10-CM

## 2024-05-30 DIAGNOSIS — E782 Mixed hyperlipidemia: Secondary | ICD-10-CM

## 2024-05-30 DIAGNOSIS — Z79899 Other long term (current) drug therapy: Secondary | ICD-10-CM

## 2024-05-30 DIAGNOSIS — R7303 Prediabetes: Secondary | ICD-10-CM

## 2024-05-30 DIAGNOSIS — Z23 Encounter for immunization: Secondary | ICD-10-CM | POA: Diagnosis not present

## 2024-05-30 DIAGNOSIS — Z6830 Body mass index (BMI) 30.0-30.9, adult: Secondary | ICD-10-CM

## 2024-05-30 LAB — LIPID PANEL
Cholesterol: 228 mg/dL — ABNORMAL HIGH (ref 0–200)
HDL: 74.4 mg/dL (ref >39.00)
LDL Cholesterol: 122 mg/dL — ABNORMAL HIGH (ref 0–99)
NonHDL: 153.78
Total CHOL/HDL Ratio: 3
Triglycerides: 160 mg/dL — ABNORMAL HIGH (ref 0.0–149.0)
VLDL: 32 mg/dL (ref 0.0–40.0)

## 2024-05-30 LAB — HEMOGLOBIN A1C: Hgb A1c MFr Bld: 5.8 % (ref 4.6–6.5)

## 2024-05-30 LAB — COMPREHENSIVE METABOLIC PANEL WITH GFR
ALT: 18 U/L (ref 0–35)
AST: 16 U/L (ref 0–37)
Albumin: 4.5 g/dL (ref 3.5–5.2)
Alkaline Phosphatase: 62 U/L (ref 39–117)
BUN: 10 mg/dL (ref 6–23)
CO2: 30 meq/L (ref 19–32)
Calcium: 9.7 mg/dL (ref 8.4–10.5)
Chloride: 103 meq/L (ref 96–112)
Creatinine, Ser: 0.72 mg/dL (ref 0.40–1.20)
GFR: 97.71 mL/min (ref >60.00)
Glucose, Bld: 104 mg/dL — ABNORMAL HIGH (ref 70–99)
Potassium: 4.8 meq/L (ref 3.5–5.1)
Sodium: 141 meq/L (ref 135–145)
Total Bilirubin: 0.5 mg/dL (ref 0.2–1.2)
Total Protein: 7.1 g/dL (ref 6.0–8.3)

## 2024-05-30 LAB — VITAMIN B12: Vitamin B-12: 366 pg/mL (ref 211–911)

## 2024-05-30 LAB — VITAMIN D 25 HYDROXY (VIT D DEFICIENCY, FRACTURES): VITD: 21.04 ng/mL — ABNORMAL LOW (ref 30.00–100.00)

## 2024-05-30 MED ORDER — GABAPENTIN 100 MG PO CAPS: ORAL_CAPSULE | ORAL | 1 refills | Status: AC

## 2024-05-30 NOTE — Progress Notes (Signed)
 Established Patient Office Visit  Subjective:     Patient ID: Ann Gonzalez, female    DOB: [birth year], 48 y.o.   MRN: [id number]  Chief Complaint  Patient presents with   Follow-up    HPI  Discussed the use of AI scribe software for clinical note transcription with the patient, who gave verbal consent to proceed.  History of Present Illness Ann Gonzalez is a 48 year old female who presents for medication management.  Glycemic monitoring and risk factors - Concern for elevated blood glucose levels despite normal hemoglobin A1c - Family history of diabetes - Desires close monitoring of blood sugar  Medication management - Taking maximum dose of vitamin D  with perceived benefit - Currently taking gabapentin   Immunization status - Plans to receive second shingles vaccination - Considering tetanus vaccination  Lifestyle and psychosocial stressors - Recent life stressors impacting ability to maintain regular physical activity and healthy diet  Menopausal symptoms - No worsening of menopausal symptoms such as cramps or sweating     ROS Per HPI      Objective:    BP 118/84 (BP Location: Left Arm, Patient Position: Sitting)   Pulse 76   Temp 98.7 F (37.1 C) (Temporal)   Ht 5' 7 (1.702 m)   Wt 195 lb 6.4 oz (88.6 kg)   SpO2 97%   BMI 30.60 kg/m    Physical Exam Vitals and nursing note reviewed.  Constitutional:      General: She is not in acute distress.    Appearance: Normal appearance.  HENT:     Head: Normocephalic and atraumatic.     Right Ear: External ear normal.     Left Ear: External ear normal.     Nose: Nose normal.     Mouth/Throat:     Mouth: Mucous membranes are moist.     Pharynx: Oropharynx is clear.  Eyes:     Extraocular Movements: Extraocular movements intact.     Pupils: Pupils are equal, round, and reactive to light.  Cardiovascular:     Rate and Rhythm: Normal rate and regular rhythm.     Pulses: Normal pulses.      Heart sounds: Normal heart sounds.  Pulmonary:     Effort: Pulmonary effort is normal. No respiratory distress.     Breath sounds: Normal breath sounds. No wheezing, rhonchi or rales.  Musculoskeletal:        General: Normal range of motion.     Cervical back: Normal range of motion.     Right lower leg: No edema.     Left lower leg: No edema.  Lymphadenopathy:     Cervical: No cervical adenopathy.  Neurological:     General: No focal deficit present.     Mental Status: She is alert and oriented to person, place, and time.  Psychiatric:        Mood and Affect: Mood normal.        Thought Content: Thought content normal.     No results found for any visits on 05/30/24.  The 10-year ASCVD risk score (Arnett DK, et al., 2019) is: 1%  BP Readings from Last 3 Encounters:  05/30/24 118/84  02/01/24 130/80  10/09/23 130/82   Wt Readings from Last 3 Encounters:  05/30/24 195 lb 6.4 oz (88.6 kg)  02/01/24 192 lb (87.1 kg)  10/09/23 193 lb (87.5 kg)      Last CBC Lab Results  Component Value Date   WBC 7.6 02/01/2024  HGB 14.3 02/01/2024   HCT 42.0 02/01/2024   MCV 96.7 02/01/2024   MCH 32.1 01/16/2023   RDW 12.7 02/01/2024   PLT 257.0 02/01/2024   Last metabolic panel Lab Results  Component Value Date   GLUCOSE 117 (H) 02/01/2024   NA 139 02/01/2024   K 4.4 02/01/2024   CL 102 02/01/2024   CO2 31 02/01/2024   BUN 10 02/01/2024   CREATININE 0.96 02/01/2024   GFR 69.35 02/01/2024   CALCIUM 9.8 02/01/2024   PROT 7.6 02/01/2024   ALBUMIN 4.6 02/01/2024   BILITOT 0.6 02/01/2024   ALKPHOS 61 02/01/2024   AST 22 02/01/2024   ALT 19 02/01/2024   Last lipids Lab Results  Component Value Date   CHOL 234 (H) 02/01/2024   HDL 69.50 02/01/2024   LDLCALC 121 (H) 02/01/2024   TRIG 219.0 (H) 02/01/2024   CHOLHDL 3 02/01/2024   Last hemoglobin A1c Lab Results  Component Value Date   HGBA1C 5.6 02/01/2024   Last thyroid  functions Lab Results  Component Value  Date   TSH 1.67 10/09/2023   Last vitamin D  Lab Results  Component Value Date   VD25OH 27.35 (L) 02/01/2024   Last vitamin B12 and Folate Lab Results  Component Value Date   VITAMINB12 176 (L) 02/01/2024         Assessment & Plan:   Assessment and Plan Assessment & Plan Medication Management Gabapentin  refill requested. - Refill gabapentin  prescription. - Labs today, will dose adjust as needed  Prediabetes Blood sugar elevated, A1c normal. Family history of diabetes. Aware of risk and wants close monitoring. - Order repeat A1c and blood sugar tests.  Mixed Hyperlipidemia - Lipids today  Vitamin D  Deficiency Improved symptoms on maximum dose.  Vitamin B12 Deficiency - Vitamin B12 levels today, will treat as indicated  BMI 30 - Continue efforts in healthy diet and activity level  Immunization due Due for flu vaccine. Bone density screening not needed. Plans for shingles and tetanus shots. - Schedule second shingles shot in six months. - Consider tetanus shot in six months.     Orders Placed This Encounter  Procedures   Flu vaccine trivalent PF, 6mos and older(Flulaval,Afluria,Fluarix,Fluzone)   Zoster Recombinant (Shingrix )   Comprehensive metabolic panel with GFR    Release to patient:   Immediate [1]   Lipid panel   Vitamin B12   VITAMIN D  25 Hydroxy (Vit-D Deficiency, Fractures)   HgB A1c     Meds ordered this encounter  Medications   gabapentin  (NEURONTIN ) 100 MG capsule    Sig: TAKE 1 TO 3 CAPSULES BY MOUTH AT BEDTIME FOR SLEEP OR HOT FLASHES    Dispense:  270 capsule    Refill:  1    Return in about 6 months (around 11/28/2024) for cpe.  Corean LITTIE Ku, FNP

## 2024-06-01 ENCOUNTER — Ambulatory Visit: Payer: Self-pay | Admitting: Family Medicine

## 2024-06-01 DIAGNOSIS — E559 Vitamin D deficiency, unspecified: Secondary | ICD-10-CM

## 2024-06-01 MED ORDER — VITAMIN D (ERGOCALCIFEROL) 1.25 MG (50000 UNIT) PO CAPS: 50000.0000 [IU] | ORAL_CAPSULE | ORAL | 0 refills | Status: AC

## 2024-06-24 ENCOUNTER — Ambulatory Visit (AMBULATORY_SURGERY_CENTER)

## 2024-06-24 VITALS — Ht 67.0 in | Wt 190.0 lb

## 2024-06-24 DIAGNOSIS — Z860100 Personal history of colon polyps, unspecified: Secondary | ICD-10-CM

## 2024-06-24 MED ORDER — NA SULFATE-K SULFATE-MG SULF 17.5-3.13-1.6 GM/177ML PO SOLN: 1.0000 | Freq: Once | ORAL | 0 refills | Status: AC

## 2024-06-24 MED ORDER — ONDANSETRON HCL 4 MG PO TABS: 4.0000 mg | ORAL_TABLET | ORAL | 0 refills | Status: AC

## 2024-06-24 NOTE — Progress Notes (Signed)
 No egg or soy allergy known to patient  No issues known to pt with past sedation with any surgeries or procedures Patient denies ever being told they had issues or difficulty with intubation  No FH of Malignant Hyperthermia Pt is not on diet pills Pt is not on  home 02  Pt is not on blood thinners  Pt denies issues with constipation. Diarrhea on occasion.  No A fib or A flutter Have any cardiac testing pending-- no  LOA: independent  Prep: suprep  Patient's chart reviewed by Norleen Schillings CNRA prior to previsit and patient appropriate for the [facility].  Previsit completed and red dot placed by patient's name on their procedure day (on provider's schedule).     PV completed with patient. Prep instructions sent via mychart and home address.

## 2024-07-01 ENCOUNTER — Encounter: Payer: Self-pay | Admitting: Pediatrics

## 2024-07-06 NOTE — Progress Notes (Unsigned)
 [facility] History and Physical   Primary Care Physician:  Alvia Corean CROME, FNP   Reason for Procedure:  History of sessile serrated polyp  Plan:    Colonoscopy   The patient was provided an opportunity to ask questions and all were answered. The patient agreed with the plan.   HPI: Ann Gonzalez is a 48 y.o. female undergoing colonoscopy for surveillance of sessile serrated polyp.  Patient's last colonoscopy was performed in 2022 which disclosed a 1 cm sessile serrated polyp as well as a hyperplastic polyp.  There is a pertinent family history of colorectal cancer in the patient's maternal grandfather.  Also noted that the patient's mother has had colon polyps.  Patient denies current symptoms of rectal bleeding or change in bowel habits.   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    Costochondritis 11/25/2014   Dysplasia of cervix    GERD (gastroesophageal reflux disease)    during pregnancy   Headache(784.0)    migraines as teenager   HSV (herpes simplex virus) infection    Newborn product of IVF pregnancy    Prediabetes 11/25/2014   S/P cesarean section 09/15/2013    Past Surgical History:  Procedure Laterality Date   CESAREAN SECTION N/A 09/15/2013   Procedure: CESAREAN SECTION;  Surgeon: Truman Corona, MD;  Location: [facility];  Service: Obstetrics;  Laterality: N/A;  PRIMARY EDC 1/20   CRYOABLATION     embryo transfer     SEPTOPLASTY     WISDOM TOOTH EXTRACTION      Prior to Admission medications   Medication Sig Start Date End Date Taking? Authorizing Provider  Cholecalciferol 50 MCG (2000 UT) CAPS Take 1 capsule by mouth daily.    [provider]  cyanocobalamin  (VITAMIN B12) 1000 MCG tablet Take 1,000 mcg by mouth daily.    [provider]  gabapentin  (NEURONTIN ) 100 MG capsule TAKE 1 TO 3 CAPSULES BY MOUTH AT BEDTIME FOR SLEEP OR HOT FLASHES  05/30/24   Alvia Corean CROME, FNP  ondansetron  (ZOFRAN ) 4 MG tablet Take 1 tablet (4 mg total) by mouth as directed. 06/24/24   Suzann Inocente HERO, MD  triamcinolone  cream (KENALOG ) 0.1 % Apply 1 Application topically 2 (two) times daily. Patient not taking: Reported on 06/24/2024 02/01/24   Alvia Corean CROME, FNP  valACYclovir  (VALTREX ) 500 MG tablet Take 1 tablet (500 mg total) by mouth daily. 02/01/24   Alvia Corean CROME, FNP  Vitamin D , Ergocalciferol , (DRISDOL ) 1.25 MG (50000 UNIT) CAPS capsule Take 1 capsule (50,000 Units total) by mouth every 7 (seven) days. 06/01/24   Alvia Corean CROME, FNP    Current Outpatient Medications  Medication Sig Dispense Refill   Cholecalciferol 50 MCG (2000 UT) CAPS Take 1 capsule by mouth daily.     cyanocobalamin  (VITAMIN B12) 1000 MCG tablet Take 1,000 mcg by mouth daily.     gabapentin  (NEURONTIN ) 100 MG capsule TAKE 1 TO 3 CAPSULES BY MOUTH AT BEDTIME FOR SLEEP OR HOT FLASHES 270 capsule 1   valACYclovir  (VALTREX ) 500 MG tablet Take 1 tablet (500 mg total) by mouth daily. 90 tablet 6   ondansetron  (ZOFRAN ) 4 MG tablet Take 1 tablet (4 mg total) by mouth as directed. 2 tablet 0   triamcinolone  cream (KENALOG ) 0.1 % Apply 1 Application topically 2 (two) times daily. (Patient not taking: Reported on 06/24/2024) 30 g 0   Vitamin D , Ergocalciferol , (DRISDOL ) 1.25  MG (50000 UNIT) CAPS capsule Take 1 capsule (50,000 Units total) by mouth every 7 (seven) days. 8 capsule 0   Current Facility-Administered Medications  Medication Dose Route Frequency Provider Last Rate Last Admin   0.9 %  sodium chloride  infusion  500 mL Intravenous Once Afrika Brick, Inocente HERO, MD        Allergies as of 07/08/2024 - Review Complete 07/08/2024  Allergen Reaction Noted   Other Rash 05/25/2019   Wound dressing adhesive Rash 01/12/2021    Family History  Problem Relation Age of Onset   Colon polyps Mother        HOCP   Rheum arthritis Mother        80s   Diabetes type  II Mother    Hypertension Mother    Chronic bronchitis Mother    Polymyositis Father        8s   Diabetes type II Sister    Diabetes Maternal Aunt    Stroke Maternal Grandmother    Colon cancer Maternal Grandfather        70's   Cancer Maternal Grandfather    Tuberculosis Maternal Grandfather    Esophageal cancer Neg Hx    Stomach cancer Neg Hx    Rectal cancer Neg Hx     Social History   Socioeconomic History   Marital status: Single    Spouse name: Not on file   Number of children: Not on file   Years of education: Not on file   Highest education level: Doctorate  Occupational History   Not on file  Tobacco Use   Smoking status: Former    Current packs/day: 0.00    Average packs/day: 0.8 packs/day for 10.0 years (7.5 ttl pk-yrs)    Types: Cigarettes    Start date: 09/12/1998    Quit date: 09/12/2008    Years since quitting: 15.8   Smokeless tobacco: Never  Vaping Use   Vaping status: Never Used  Substance and Sexual Activity   Alcohol use: Yes    Alcohol/week: 2.0 standard drinks of alcohol    Types: 2 Glasses of wine per week   Drug use: No   Sexual activity: Yes    Partners: Male    Birth control/protection: None  Other Topics Concern   Not on file  Social History Narrative   Not on file   Social Drivers of Health   Financial Resource Strain: Low Risk  (10/08/2023)   Overall Financial Resource Strain (CARDIA)    Difficulty of Paying Living Expenses: Not hard at all  Food Insecurity: No Food Insecurity (10/08/2023)   Hunger Vital Sign    Worried About Running Out of Food in the Last Year: Never true    Ran Out of Food in the Last Year: Never true  Transportation Needs: No Transportation Needs (10/08/2023)   PRAPARE - Administrator, Civil Service (Medical): No    Lack of Transportation (Non-Medical): No  Physical Activity: Insufficiently Active (10/08/2023)   Exercise Vital Sign    Days of Exercise per Week: 1 day    Minutes of Exercise per  Session: 20 min  Stress: Stress Concern Present (10/08/2023)   [employer] of Occupational Health - Occupational Stress Questionnaire    Feeling of Stress : To some extent  Social Connections: Socially Integrated (10/08/2023)   Social Connection and Isolation Panel    Frequency of Communication with Friends and Family: More than three times a week    Frequency of Social Gatherings with  Friends and Family: Three times a week    Attends Religious Services: More than 4 times per year    Active Member of Clubs or Organizations: Yes    Attends Banker Meetings: More than 4 times per year    Marital Status: Married  Catering Manager Violence: Unknown (12/02/2021)   Received from [facility]   HITS    Physically Hurt: Not on file    Insult or Talk Down To: Not on file    Threaten Physical Harm: Not on file    Scream or Curse: Not on file    Review of Systems:  All other review of systems negative except as mentioned in the HPI.  Physical Exam: Vital signs BP 132/82   Pulse 67   Temp 98.1 F (36.7 C)   Ht 5' 7 (1.702 m)   Wt 190 lb (86.2 kg)   SpO2 99%   BMI 29.76 kg/m   General:   Alert,  Well-developed, well-nourished, pleasant and cooperative in NAD Airway:  Mallampati 1 Lungs:  Clear throughout to auscultation.   Heart:  Regular rate and rhythm; no murmurs, clicks, rubs,  or gallops. Abdomen:  Soft, nontender and nondistended. Normal bowel sounds.   Neuro/Psych:  Normal mood and affect. A and O x 3  Inocente Hausen, MD [facility]

## 2024-07-08 ENCOUNTER — Ambulatory Visit (AMBULATORY_SURGERY_CENTER): Admitting: Pediatrics

## 2024-07-08 ENCOUNTER — Encounter: Payer: Self-pay | Admitting: Pediatrics

## 2024-07-08 VITALS — BP 121/74 | HR 68 | Temp 98.1°F | Resp 15 | Ht 67.0 in | Wt 190.0 lb

## 2024-07-08 DIAGNOSIS — Z860100 Personal history of colon polyps, unspecified: Secondary | ICD-10-CM

## 2024-07-08 DIAGNOSIS — K648 Other hemorrhoids: Secondary | ICD-10-CM | POA: Diagnosis not present

## 2024-07-08 DIAGNOSIS — Z860101 Personal history of adenomatous and serrated colon polyps: Secondary | ICD-10-CM | POA: Diagnosis not present

## 2024-07-08 DIAGNOSIS — Z800 Family history of malignant neoplasm of digestive organs: Secondary | ICD-10-CM | POA: Diagnosis not present

## 2024-07-08 DIAGNOSIS — Z83719 Family history of colon polyps, unspecified: Secondary | ICD-10-CM

## 2024-07-08 DIAGNOSIS — D128 Benign neoplasm of rectum: Secondary | ICD-10-CM

## 2024-07-08 DIAGNOSIS — Z1211 Encounter for screening for malignant neoplasm of colon: Secondary | ICD-10-CM | POA: Diagnosis present

## 2024-07-08 DIAGNOSIS — D125 Benign neoplasm of sigmoid colon: Secondary | ICD-10-CM

## 2024-07-08 DIAGNOSIS — K635 Polyp of colon: Secondary | ICD-10-CM

## 2024-07-08 MED ORDER — SODIUM CHLORIDE 0.9 % IV SOLN: 500.0000 mL | Freq: Once | INTRAVENOUS | Status: DC

## 2024-07-08 NOTE — Progress Notes (Signed)
 Pt's states no medical or surgical changes since previsit or office visit.

## 2024-07-08 NOTE — Progress Notes (Signed)
 Transferred to PACU via stretcher. Patient arousing to stimulation.  VSS upon leaving procedure room.

## 2024-07-08 NOTE — Progress Notes (Signed)
 Called to room to assist during endoscopic procedure.  Patient ID and intended procedure confirmed with present staff. Received instructions for my participation in the procedure from the performing physician.

## 2024-07-08 NOTE — Op Note (Addendum)
 [facility] Patient Name: Ann Gonzalez Procedure Date: 07/08/2024 11:04 AM MRN: [id number] Endoscopist: Inocente Hausen , MD, [id number] Age: 48 Referring MD:  Date of Birth: 31-Oct-1975 Gender: Female Account #: [id number] Procedure:                Colonoscopy Indications:              High risk colon cancer surveillance: Personal                            history of sessile serrated colon polyp (10 mm or                            greater in size), Last colonoscopy: August 2022,                            Family history of colon cancer in a distant                            relative, Family history of colonic polyps in a                            first-degree relative Medicines:                Monitored Anesthesia Care Procedure:                Pre-Anesthesia Assessment:                           - Prior to the procedure, a History and Physical                            was performed, and patient medications and                            allergies were reviewed. The patient's tolerance of                            previous anesthesia was also reviewed. The risks                            and benefits of the procedure and the sedation                            options and risks were discussed with the patient.                            All questions were answered, and informed consent                            was obtained. Prior Anticoagulants: The patient has                            taken no anticoagulant or antiplatelet agents. ASA  Grade Assessment: II - A patient with mild systemic                            disease. After reviewing the risks and benefits,                            the patient was deemed in satisfactory condition to                            undergo the procedure.                           After obtaining informed consent, the colonoscope                            was passed under direct vision. Throughout the                             procedure, the patient's blood pressure, pulse, and                            oxygen saturations were monitored continuously. The                            Olympus Scope SN: [id number] was introduced through                            the anus and advanced to the cecum, identified by                            appendiceal orifice and ileocecal valve. The                            colonoscopy was performed without difficulty. The                            patient tolerated the procedure well. The quality                            of the bowel preparation was good. The ileocecal                            valve, appendiceal orifice, and rectum were                            photographed. Scope In: 11:17:50 AM Scope Out: 11:31:18 AM Scope Withdrawal Time: 0 hours 10 minutes 36 seconds  Total Procedure Duration: 0 hours 13 minutes 28 seconds  Findings:                 Hemorrhoids were found on perianal exam.                           The digital rectal exam was normal. Pertinent  negatives include normal sphincter tone and no                            palpable rectal lesions.                           A 3 mm polyp was found in the sigmoid colon. The                            polyp was sessile. The polyp was removed with a                            cold biopsy forceps. Resection and retrieval were                            complete.                           A 5 mm polyp was found in the rectum. The polyp was                            sessile. The polyp was removed with a cold snare.                            Resection and retrieval were complete.                           Internal hemorrhoids were found during retroflexion. Complications:            No immediate complications. Estimated blood loss:                            Minimal. Estimated Blood Loss:     Estimated blood loss was minimal. Impression:               -  Hemorrhoids found on perianal exam.                           - One 3 mm polyp in the sigmoid colon, removed with                            a cold biopsy forceps. Resected and retrieved.                           - One 5 mm polyp in the rectum, removed with a cold                            snare. Resected and retrieved.                           - Internal hemorrhoids. Recommendation:           - Discharge patient to home (ambulatory).                           -  Await pathology results.                           - Repeat colonoscopy for surveillance based on                            pathology results.                           - The findings and recommendations were discussed                            with the patient's family.                           - Return to referring physician.                           - Patient has a contact number available for                            emergencies. The signs and symptoms of potential                            delayed complications were discussed with the                            patient. Return to normal activities tomorrow.                            Written discharge instructions were provided to the                            patient. Inocente Hausen, MD 07/08/2024 11:36:30 AM This report has been signed electronically. Addendum Number: 1   Addendum Date: 07/08/2024 5:20:51 PM      An abdominal binder was utilized to facilitate successful completion of       today's procedure. Inocente Hausen, MD 07/08/2024 5:21:07 PM This report has been signed electronically.

## 2024-07-08 NOTE — Patient Instructions (Signed)
 Resume previous diet Continue present medications Awaiting pathology results Repeat colonoscopy for surveillance based on pathology results Handouts provided on polyps and hemorrhoids.  YOU HAD AN ENDOSCOPIC PROCEDURE TODAY AT THE [facility]:   Refer to the procedure report that was given to you for any specific questions about what was found during the examination.  If the procedure report does not answer your questions, please call your gastroenterologist to clarify.  If you requested that your care partner not be given the details of your procedure findings, then the procedure report has been included in a sealed envelope for you to review at your convenience later.  YOU SHOULD EXPECT: Some feelings of bloating in the abdomen. Passage of more gas than usual.  Walking can help get rid of the air that was put into your GI tract during the procedure and reduce the bloating. If you had a lower endoscopy (such as a colonoscopy or flexible sigmoidoscopy) you may notice spotting of blood in your stool or on the toilet paper. If you underwent a bowel prep for your procedure, you may not have a normal bowel movement for a few days.  Please Note:  You might notice some irritation and congestion in your nose or some drainage.  This is from the oxygen used during your procedure.  There is no need for concern and it should clear up in a day or so.  SYMPTOMS TO REPORT IMMEDIATELY:  Following lower endoscopy (colonoscopy or flexible sigmoidoscopy):  Excessive amounts of blood in the stool  Significant tenderness or worsening of abdominal pains  Swelling of the abdomen that is new, acute  Fever of 100F or higher  For urgent or emergent issues, a gastroenterologist can be reached at any hour by calling [phone]. Do not use MyChart messaging for urgent concerns.    DIET:  We do recommend a small meal at first, but then you may proceed to your regular diet.  Drink plenty of fluids but  you should avoid alcoholic beverages for 24 hours.  ACTIVITY:  You should plan to take it easy for the rest of today and you should NOT DRIVE or use heavy machinery until tomorrow (because of the sedation medicines used during the test).    FOLLOW UP: Our staff will call the number listed on your records the next business day following your procedure.  We will call around 7:15- 8:00 am to check on you and address any questions or concerns that you may have regarding the information given to you following your procedure. If we do not reach you, we will leave a message.     If any biopsies were taken you will be contacted by phone or by letter within the next 1-3 weeks.  Please call us  at [phone] if you have not heard about the biopsies in 3 weeks.    SIGNATURES/CONFIDENTIALITY: You and/or your care partner have signed paperwork which will be entered into your electronic medical record.  These signatures attest to the fact that that the information above on your After Visit Summary has been reviewed and is understood.  Full responsibility of the confidentiality of this discharge information lies with you and/or your care-partner.

## 2024-07-09 ENCOUNTER — Telehealth: Payer: Self-pay

## 2024-07-09 NOTE — Telephone Encounter (Signed)
  Follow up Call-     07/08/2024   10:09 AM  Call back number  Post procedure Call Back phone  # [phone]  Permission to leave phone message Yes    Left message

## 2024-07-11 ENCOUNTER — Ambulatory Visit: Payer: Self-pay | Admitting: Pediatrics

## 2024-07-11 LAB — SURGICAL PATHOLOGY

## 2024-07-27 ENCOUNTER — Other Ambulatory Visit: Payer: Self-pay | Admitting: Family Medicine

## 2024-07-27 DIAGNOSIS — E559 Vitamin D deficiency, unspecified: Secondary | ICD-10-CM
